# Patient Record
Sex: Female | Born: 1995 | Race: Black or African American | Hispanic: No | Smoking: Former smoker
Health system: Southern US, Community
[De-identification: ages and names within clinical notes are randomized; demographics above are authoritative.]

## PROBLEM LIST (undated history)

## (undated) DIAGNOSIS — F329 Major depressive disorder, single episode, unspecified: Secondary | ICD-10-CM

## (undated) DIAGNOSIS — N760 Acute vaginitis: Secondary | ICD-10-CM

## (undated) DIAGNOSIS — B9689 Other specified bacterial agents as the cause of diseases classified elsewhere: Secondary | ICD-10-CM

## (undated) DIAGNOSIS — F419 Anxiety disorder, unspecified: Secondary | ICD-10-CM

## (undated) DIAGNOSIS — Z8619 Personal history of other infectious and parasitic diseases: Secondary | ICD-10-CM

## (undated) DIAGNOSIS — A6000 Herpesviral infection of urogenital system, unspecified: Secondary | ICD-10-CM

## (undated) HISTORY — DX: Major depressive disorder, single episode, unspecified: F32.9

## (undated) HISTORY — DX: Anxiety disorder, unspecified: F41.9

---
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
## 2015-01-02 HISTORY — PX: WISDOM TOOTH EXTRACTION: SHX21

## 2015-11-13 ENCOUNTER — Ambulatory Visit
Admission: EM | Admit: 2015-11-13 | Discharge: 2015-11-13 | Disposition: A | Discharge status: Home / Self Care | Payer: BLUE CROSS/BLUE SHIELD | Attending: Family Medicine | Admitting: Family Medicine

## 2015-11-13 DIAGNOSIS — B373 Candidiasis of vulva and vagina: Secondary | ICD-10-CM | POA: Diagnosis not present

## 2015-11-13 DIAGNOSIS — N760 Acute vaginitis: Secondary | ICD-10-CM | POA: Diagnosis not present

## 2015-11-13 DIAGNOSIS — B3731 Acute candidiasis of vulva and vagina: Secondary | ICD-10-CM

## 2015-11-13 DIAGNOSIS — N766 Ulceration of vulva: Secondary | ICD-10-CM | POA: Diagnosis not present

## 2015-11-13 LAB — URINALYSIS COMPLETE WITH MICROSCOPIC (ARMC ONLY)
Bilirubin Urine: NEGATIVE
Glucose, UA: NEGATIVE mg/dL
Hgb urine dipstick: NEGATIVE
KETONES UR: NEGATIVE mg/dL
NITRITE: NEGATIVE
PH: 7 (ref 5.0–8.0)
PROTEIN: NEGATIVE mg/dL
SPECIFIC GRAVITY, URINE: 1.025 (ref 1.005–1.030)

## 2015-11-13 LAB — CHLAMYDIA/NGC RT PCR (ARMC ONLY)
Chlamydia Tr: NOT DETECTED
N GONORRHOEAE: NOT DETECTED

## 2015-11-13 LAB — WET PREP, GENITAL
Clue Cells Wet Prep HPF POC: NONE SEEN
Sperm: NONE SEEN
TRICH WET PREP: NONE SEEN

## 2015-11-13 LAB — PREGNANCY, URINE: Preg Test, Ur: NEGATIVE

## 2015-11-13 MED ORDER — METRONIDAZOLE 500 MG PO TABS: 500.0000 mg | ORAL_TABLET | Freq: Two times a day (BID) | ORAL | Status: DC

## 2015-11-13 MED ORDER — FLUCONAZOLE 150 MG PO TABS
150.0000 mg | ORAL_TABLET | Freq: Once | ORAL | Status: DC
Start: 2015-11-13 — End: 2016-04-28

## 2015-11-13 MED ORDER — VALACYCLOVIR HCL 1 G PO TABS: 1000.0000 mg | ORAL_TABLET | Freq: Two times a day (BID) | ORAL | Status: DC

## 2015-11-13 NOTE — ED Provider Notes (Signed)
CSN: [id number]     Arrival date & time 11/13/15  0911 History   First MD Initiated Contact with Patient 11/13/15 [phone]    Nurses notes were reviewed. Chief Complaint  Patient presents with  . Vaginal Pain   Patient is here because of vaginal pain. The back pain started Thursday. She reports the pain over her labia area. She has noticed some lesions over the labia area She's never had anything like this before. She states she has no cyst discharge which she states she's somewhat normal for her. She's never had a pelvic exam she is to have been pregnant before. She reports last time she was sexually active last weekend.  Past history no previous history of operations not any medications no known drug allergies and she's never smoked.. No pertinent family medical past or surgical history for her.  (Consider location/radiation/quality/duration/timing/severity/associated sxs/prior Treatment) Patient is a 20 y.o. female presenting with vaginal discharge. The history is provided by the patient. No language interpreter was used.  Vaginal Discharge Quality:  Mucopurulent Severity:  Mild Onset quality:  Unable to specify Chronicity:  New Context: not after intercourse and not after urination   Relieved by:  Nothing Worsened by:  Nothing tried Associated symptoms: genital lesions, rash and vaginal itching   Risk factors: unprotected sex     History reviewed. No pertinent past medical history. Past Surgical History  Procedure Laterality Date  . Wisdom tooth extraction  01/2015  . Tonsillectomy  05/2014   History reviewed. No pertinent family history. Social History  Substance Use Topics  . Smoking status: Never Smoker   . Smokeless tobacco: None  . Alcohol Use: 0.0 oz/week    0 Standard drinks or equivalent per week     Comment: occasionally   OB History    No data available     Review of Systems  Genitourinary: Positive for vaginal discharge and vaginal pain.  All other systems  reviewed and are negative.   Allergies  Review of patient's allergies indicates no known allergies.  Home Medications   Prior to Admission medications   Medication Sig Start Date End Date Taking? Authorizing Provider  fluconazole (DIFLUCAN) 150 MG tablet Take 1 tablet (150 mg total) by mouth once. 11/13/15   Hassan RowanEugene Wiatt Mahabir, MD  metroNIDAZOLE (FLAGYL) 500 MG tablet Take 1 tablet (500 mg total) by mouth 2 (two) times daily. 11/13/15   Hassan RowanEugene Marlea Gambill, MD  valACYclovir (VALTREX) 1000 MG tablet Take 1 tablet (1,000 mg total) by mouth 2 (two) times daily. 11/13/15   Hassan RowanEugene Ramell Wacha, MD   Meds Ordered and Administered this Visit  Medications - No data to display  BP 113/74 mmHg  Pulse 89  Temp(Src) 98.2 F (36.8 C) (Oral)  Resp 17  Ht 5\' 3"  (1.6 m)  Wt 190 lb (86.183 kg)  BMI 33.67 kg/m2  SpO2 100%  LMP 10/26/2015 No data found.   Physical Exam  Constitutional: She is oriented to person, place, and time. She appears well-developed.  HENT:  Head: Normocephalic and atraumatic.  Eyes: Pupils are equal, round, and reactive to light.  Neck: Normal range of motion.  Abdominal: Soft. Bowel sounds are normal. She exhibits no distension. There is no tenderness. Hernia confirmed negative in the right inguinal area and confirmed negative in the left inguinal area.  Genitourinary: Rectal exam shows no external hemorrhoid, no internal hemorrhoid, no fissure and no tenderness.    There is lesion on the right labia. There is no injury on the right labia.  There is lesion on the left labia. There is no injury on the left labia. Uterus is not deviated, not enlarged, not fixed and not tender. Cervix exhibits no motion tenderness, no discharge and no friability. Right adnexum displays no mass and no tenderness. Left adnexum displays no mass and no tenderness. No tenderness or bleeding in the vagina. No foreign body around the vagina. Vaginal discharge found.  Multiple ulcerations along the labia both the right left  labia. Heavy discharge present. Patient was upset during the whole examination informed us that she never had a pelvic exam before. Bimanual examination was challenging since patient was unable to relax. With the challenges of speculum examination no rectal examination was done and examination was ended since patient was visibly upset.  Musculoskeletal: Normal range of motion.  Lymphadenopathy:       Right: No inguinal adenopathy present.  Neurological: She is alert and oriented to person, place, and time.  Skin: Skin is warm.  Psychiatric: She has a normal mood and affect. Her behavior is normal.  Vitals reviewed.   ED Course  Procedures (including critical care time)  Labs Review Labs Reviewed  WET PREP, GENITAL - Abnormal; Notable for the following:    Yeast Wet Prep HPF POC PRESENT (*)    WBC, Wet Prep HPF POC MANY (*)    All other components within normal limits  URINALYSIS COMPLETEWITH MICROSCOPIC ([facility] ONLY) - Abnormal; Notable for the following:    Leukocytes, UA TRACE (*)    Bacteria, UA FEW (*)    Squamous Epithelial / LPF 0-5 (*)    All other components within normal limits  CHLAMYDIA/NGC RT PCR ([facility] ONLY)  HSV CULTURE AND TYPING  PREGNANCY, URINE  RPR  HIV ANTIBODY (ROUTINE TESTING)    Imaging Review No results found.   Visual Acuity Review  Right Eye Distance:   Left Eye Distance:   Bilateral Distance:    Right Eye Near:   Left Eye Near:    Bilateral Near:     Results for orders placed or performed during the hospital encounter of 11/13/15  Wet prep, genital  Result Value Ref Range   Yeast Wet Prep HPF POC PRESENT (A) NONE SEEN   Trich, Wet Prep NONE SEEN NONE SEEN   Clue Cells Wet Prep HPF POC NONE SEEN NONE SEEN   WBC, Wet Prep HPF POC MANY (A) NONE SEEN   Sperm NONE SEEN   Urinalysis complete, with microscopic  Result Value Ref Range   Color, Urine YELLOW YELLOW   APPearance CLEAR CLEAR   Glucose, UA NEGATIVE NEGATIVE mg/dL    Bilirubin Urine NEGATIVE NEGATIVE   Ketones, ur NEGATIVE NEGATIVE mg/dL   Specific Gravity, Urine 1.025 1.005 - 1.030   Hgb urine dipstick NEGATIVE NEGATIVE   pH 7.0 5.0 - 8.0   Protein, ur NEGATIVE NEGATIVE mg/dL   Nitrite NEGATIVE NEGATIVE   Leukocytes, UA TRACE (A) NEGATIVE   RBC / HPF 0-5 0 - 5 RBC/hpf   WBC, UA 6-30 0 - 5 WBC/hpf   Bacteria, UA FEW (A) NONE SEEN   Squamous Epithelial / LPF 0-5 (A) NONE SEEN   Mucous PRESENT   Pregnancy, urine  Result Value Ref Range   Preg Test, Ur NEGATIVE NEGATIVE     MDM   1. Yeast infection of the vagina   2. Ulcer of genital labia   3. Vaginitis     At this time explain to her she has a yeast vaginitis will treat with  Diflucan, she has a heavy vaginal discharge, no clue cells were seen will treat still with Flagyl since she has had had a discharge lately before the lesions occur, and for the lesions were going to place her on Valtrex 1 g twice a day for week for looks like herpes simplex infection. Staff was instructed to inform patient cannot say this is herpes yet until we have the culture back by herpes culture was obtained. Due to her concern over the discharge and lesions she's agreed to go ahead and have HIV and RPR test done as well andtest are pending. Work note given for today and tomorrow as well. Follow-up PCP of choice.   Note: This dictation was prepared with Dragon dictation along with smaller phrase technology. Any transcriptional errors that result from this process are unintentional.     Hassan Rowan, MD 11/13/15 1149

## 2015-11-13 NOTE — ED Notes (Signed)
Patient complains of vaginal pain. Patient states that she has been having pain since last Thursday or Friday. Patient states that she is also having an abnormal discharge. No known exposure to STD. Patient states that she has pain upon urination with some frequency.

## 2015-11-13 NOTE — Discharge Instructions (Signed)
Vaginitis Vaginitis is an inflammation of the vagina. It can happen when the normal bacteria and yeast in the vagina grow too much. There are different types. Treatment will depend on the type you have. HOME CARE  Take all medicines as told by your doctor.  Keep your vagina area clean and dry. Avoid soap. Rinse the area with water.  Avoid washing and cleaning out the vagina (douching).  Do not use tampons or have sex (intercourse) until your treatment is done.  Wipe from front to back after going to the restroom.  Wear cotton underwear.  Avoid wearing underwear while you sleep until your vaginitis is gone.  Avoid tight pants. Avoid underwear or nylons without a cotton panel.  Take off wet clothing (such as a bathing suit) as soon as you can.  Use mild, unscented products. Avoid fabric softeners and scented:  Feminine sprays.  Laundry detergents.  Tampons.  Soaps or bubble baths.  Practice safe sex and use condoms. GET HELP RIGHT AWAY IF:   You have belly (abdominal) pain.  You have a fever or lasting symptoms for more than 2-3 days.  You have a fever and your symptoms suddenly get worse. MAKE SURE YOU:   Understand these instructions.  Will watch this condition.  Will get help right away if you are not doing well or get worse.   This information is not intended to replace advice given to you by your health care provider. Make sure you discuss any questions you have with your health care provider.   Document Released: 08/16/2008 Document Revised: 02/12/2012 Document Reviewed: 10/31/2011 Elsevier Interactive Patient Education 2016 Elsevier Inc.  Monilial Vaginitis Vaginitis in a soreness, swelling and redness (inflammation) of the vagina and vulva. Monilial vaginitis is not a sexually transmitted infection. CAUSES  Yeast vaginitis is caused by yeast (candida) that is normally found in your vagina. With a yeast infection, the candida has overgrown in number to a  point that upsets the chemical balance. SYMPTOMS   White, thick vaginal discharge.  Swelling, itching, redness and irritation of the vagina and possibly the lips of the vagina (vulva).  Burning or painful urination.  Painful intercourse. DIAGNOSIS  Things that may contribute to monilial vaginitis are:  Postmenopausal and virginal states.  Pregnancy.  Infections.  Being tired, sick or stressed, especially if you had monilial vaginitis in the past.  Diabetes. Good control will help lower the chance.  Birth control pills.  Tight fitting garments.  Using bubble bath, feminine sprays, douches or deodorant tampons.  Taking certain medications that kill germs (antibiotics).  Sporadic recurrence can occur if you become ill. TREATMENT  Your caregiver will give you medication.  There are several kinds of anti monilial vaginal creams and suppositories specific for monilial vaginitis. For recurrent yeast infections, use a suppository or cream in the vagina 2 times a week, or as directed.  Anti-monilial or steroid cream for the itching or irritation of the vulva may also be used. Get your caregiver's permission.  Painting the vagina with methylene blue solution may help if the monilial cream does not work.  Eating yogurt may help prevent monilial vaginitis. HOME CARE INSTRUCTIONS   Finish all medication as prescribed.  Do not have sex until treatment is completed or after your caregiver tells you it is okay.  Take warm sitz baths.  Do not douche.  Do not use tampons, especially scented ones.  Wear cotton underwear.  Avoid tight pants and panty hose.  Tell your sexual partner  that you have a yeast infection. They should go to their caregiver if they have symptoms such as mild rash or itching.  Your sexual partner should be treated as well if your infection is difficult to eliminate.  Practice safer sex. Use condoms.  Some vaginal medications cause latex condoms to  fail. Vaginal medications that harm condoms are:  Cleocin cream.  Butoconazole (Femstat).  Terconazole (Terazol) vaginal suppository.  Miconazole (Monistat) (may be purchased over the counter). SEEK MEDICAL CARE IF:   You have a temperature by mouth above 102 F (38.9 C).  The infection is getting worse after 2 days of treatment.  The infection is not getting better after 3 days of treatment.  You develop blisters in or around your vagina.  You develop vaginal bleeding, and it is not your menstrual period.  You have pain when you urinate.  You develop intestinal problems.  You have pain with sexual intercourse.   This information is not intended to replace advice given to you by your health care provider. Make sure you discuss any questions you have with your health care provider.   Document Released: 02/27/2005 Document Revised: 08/12/2011 Document Reviewed: 11/21/2014 Elsevier Interactive Patient Education 2016 Elsevier Inc. Genital Herpes Genital herpes is a common sexually transmitted infection (STI) that is caused by a virus. The virus is spread from person to person through sexual contact. Infection can cause itching, blisters, and sores in the genital area or rectal area. This is called an outbreak. It affects both men and women. Genital herpes is particularly concerning for pregnant women because the virus can be passed to the baby during delivery and cause serious problems. Genital herpes is also a concern for people with a weakened defense (immune) system. Symptoms of genital herpes may last several days and then go away. However, the virus remains in your body, so you may have more outbreaks of symptoms in the future. The time between outbreaks varies and can be months or years. CAUSES Genital herpes is caused by a virus called herpes simplex virus (HSV) type 2 or HSV type 1. These viruses are contagious and are most often spread through sexual contact with an  infected person. Sexual contact includes vaginal, anal, and oral sex. RISK FACTORS Risk factors for genital herpes include: Being sexually active with multiple partners. Having unprotected sex. SIGNS AND SYMPTOMS Symptoms may include: Pain and itching in the genital area or rectal area. Small red bumps that turn into blisters and then turn into sores. Flu-like symptoms, including: Fever. Body aches. Painful urination. Vaginal discharge. DIAGNOSIS Genital herpes may be diagnosed by: Physical exam. Blood test. Fluid culture test from an open sore. TREATMENT There is no cure for genital herpes. Oral antiviral medicines may be used to speed up healing and to help prevent the return of symptoms. These medicines can also help to reduce the spread of the virus to sexual partners. HOME CARE INSTRUCTIONS Keep the affected areas dry and clean. Take medicines only as directed by your health care provider. Do not have sexual contact during active infections. Genital herpes is contagious. Practice safe sex. Latex condoms and female condoms may help to prevent the spread of the herpes virus. Avoid rubbing or touching the blisters and sores. If you do touch the blister or sores: Wash your hands thoroughly. Do not touch your eyes afterward. If you become pregnant, tell your health care provider if you have had genital herpes. Keep all follow-up visits as directed by your health care provider.  This is important. PREVENTION Use condoms. Although anyone can contract genital herpes during sexual contact even with the use of a condom, a condom can provide some protection. Avoid having multiple sexual partners. Talk to your sexual partner about any symptoms and past history that either of you may have. Get tested before you have sex. Ask your partner to do the same. Recognize the symptoms of genital herpes. Do not have sexual contact if you notice these symptoms. SEEK MEDICAL CARE IF: Your symptoms  are not improving with medicine. Your symptoms return. You have new symptoms. You have a fever. You have abdominal pain. You have redness, swelling, or pain in your eye. MAKE SURE YOU: Understand these instructions. Will watch your condition. Will get help right away if you are not doing well or get worse.   This information is not intended to replace advice given to you by your health care provider. Make sure you discuss any questions you have with your health care provider.   Document Released: 05/17/2000 Document Revised: 06/10/2014 Document Reviewed: 10/05/2013 Elsevier Interactive Patient Education [employer]2016 Elsevier Inc.

## 2015-11-14 LAB — RPR: RPR Ser Ql: NONREACTIVE

## 2015-11-14 LAB — HIV ANTIBODY (ROUTINE TESTING W REFLEX): HIV Screen 4th Generation wRfx: NONREACTIVE

## 2015-11-15 LAB — HSV CULTURE AND TYPING

## 2015-11-16 ENCOUNTER — Telehealth: Payer: Self-pay | Admitting: Emergency Medicine

## 2015-11-16 NOTE — ED Notes (Signed)
Patient notified that her HSV culture came back positive.  Patient states that she is taking her Valtrex.  Patient was instructed to follow-up with her PCP if her symptoms do not improve and for future outbreaks.  Patient verbalized understanding.

## 2016-04-28 ENCOUNTER — Encounter: Payer: Self-pay | Admitting: Gynecology

## 2016-04-28 ENCOUNTER — Ambulatory Visit
Admission: EM | Admit: 2016-04-28 | Discharge: 2016-04-28 | Disposition: A | Discharge status: Home / Self Care | Payer: BLUE CROSS/BLUE SHIELD | Attending: Emergency Medicine | Admitting: Emergency Medicine

## 2016-04-28 DIAGNOSIS — A6000 Herpesviral infection of urogenital system, unspecified: Secondary | ICD-10-CM

## 2016-04-28 DIAGNOSIS — Z760 Encounter for issue of repeat prescription: Secondary | ICD-10-CM

## 2016-04-28 HISTORY — DX: Herpesviral infection of urogenital system, unspecified: A60.00

## 2016-04-28 MED ORDER — VALACYCLOVIR HCL 1 G PO TABS: 1000.0000 mg | ORAL_TABLET | Freq: Two times a day (BID) | ORAL | 1 refills | Status: DC

## 2016-04-28 NOTE — ED Triage Notes (Signed)
Patient c/o refill on her medication RX. Valacyclovir.

## 2016-04-28 NOTE — ED Provider Notes (Signed)
HPI  SUBJECTIVE:  Nancy Mcfarland is a 20 y.o. female who presents for refill of her Valtrex. She states that she takes 1000 mg twice a day for 3 days. She reports having a genital herpes outbreak starting 3 days ago and states that she did not have her medicine for this. She states that the Valtrex normally works well for her. She has no other complaints. FAO:ZHYQPMD:[facility]   Past Medical History:  Diagnosis Date  . Herpes genitalia     Past Surgical History:  Procedure Laterality Date  . TONSILLECTOMY  05/2014  . WISDOM TOOTH EXTRACTION  01/2015    No family history on file.  Social History  Substance Use Topics  . Smoking status: Never Smoker  . Smokeless tobacco: Never Used  . Alcohol use 0.0 oz/week     Comment: occasionally    No current facility-administered medications for this encounter.   Current Outpatient Prescriptions:  .  valACYclovir (VALTREX) 1000 MG tablet, Take 1 tablet (1,000 mg total) by mouth 2 (two) times daily., Disp: 12 tablet, Rfl: 1  No Known Allergies   ROS  As noted in HPI.   Physical Exam  BP 134/89 (BP Location: Left Arm)   Pulse 79   Temp 98.8 F (37.1 C) (Oral)   Resp 16   Ht 5\' 3"  (1.6 m)   Wt 180 lb (81.6 kg)   LMP 03/31/2016   SpO2 100%   BMI 31.89 kg/m   Constitutional: Well developed, well nourished, no acute distress Eyes:  EOMI, conjunctiva normal bilaterally HENT: Normocephalic, atraumatic,mucus membranes moist Respiratory: Normal inspiratory effort Cardiovascular: Normal rate GI: nondistended skin: No rash, skin intact Musculoskeletal: no deformities Neurologic: Alert & oriented x 3, no focal neuro deficits Psychiatric: Speech and behavior appropriate   ED Course   Medications - No data to display  No orders of the defined types were placed in this encounter.   No results found for this or any previous visit (from the past 24 hour(s)). No results found.  ED Clinical Impression  Medication  refill  ED Assessment/Plan  E rx;d Valtrex, giving patient 2 courses with one refill. She'll follow-up with her primary care physician as needed.  Meds ordered this encounter  Medications  . valACYclovir (VALTREX) 1000 MG tablet    Sig: Take 1 tablet (1,000 mg total) by mouth 2 (two) times daily.    Dispense:  12 tablet    Refill:  1    *This clinic note was created using Scientist, clinical (histocompatibility and immunogenetics)Dragon dictation software. Therefore, there may be occasional mistakes despite careful proofreading.  ?   Domenick GongAshley Mikeria Valin, MD 04/28/16 [phone]

## 2017-11-03 ENCOUNTER — Ambulatory Visit
Admission: EM | Admit: 2017-11-03 | Discharge: 2017-11-03 | Disposition: A | Discharge status: Home / Self Care | Payer: BLUE CROSS/BLUE SHIELD | Attending: Family Medicine | Admitting: Family Medicine

## 2017-11-03 ENCOUNTER — Other Ambulatory Visit: Payer: Self-pay

## 2017-11-03 DIAGNOSIS — L03312 Cellulitis of back [any part except buttock]: Secondary | ICD-10-CM | POA: Diagnosis not present

## 2017-11-03 DIAGNOSIS — M5489 Other dorsalgia: Secondary | ICD-10-CM

## 2017-11-03 MED ORDER — CEFAZOLIN SODIUM 1 G IJ SOLR
1.0000 g | Freq: Once | INTRAMUSCULAR | Status: AC
  Administered 2017-11-03: 1 g via INTRAMUSCULAR

## 2017-11-03 MED ORDER — SULFAMETHOXAZOLE-TRIMETHOPRIM 800-160 MG PO TABS: 1.0000 | ORAL_TABLET | Freq: Two times a day (BID) | ORAL | 0 refills | Status: DC

## 2017-11-03 MED ORDER — HYDROCODONE-ACETAMINOPHEN 5-325 MG PO TABS: ORAL_TABLET | ORAL | 0 refills | Status: DC

## 2017-11-03 NOTE — ED Provider Notes (Signed)
[facility]    CSN: [id number] Arrival date & time: 11/03/17  0803     History   Chief Complaint Chief Complaint  Patient presents with  . Back Pain    HPI Nancy Mcfarland is a 22 y.o. female.   22 yo female with a c/o pain to the skin on the lower back for the past 2 days. States she thinks that she may have had some bug bites. Denies any fevers, chills or drainage.   The history is provided by the patient.  Back Pain    Past Medical History:  Diagnosis Date  . Herpes genitalia     There are no active problems to display for this patient.   Past Surgical History:  Procedure Laterality Date  . TONSILLECTOMY  05/2014  . WISDOM TOOTH EXTRACTION  01/2015    OB History   None      Home Medications    Prior to Admission medications   Medication Sig Start Date End Date Taking? Authorizing Provider  HYDROcodone-acetaminophen (NORCO/VICODIN) 5-325 MG tablet 1-2 tabs po qd prn 11/03/17   Payton Mccallumonty, Samary Shatz, MD  sulfamethoxazole-trimethoprim (BACTRIM DS,SEPTRA DS) 800-160 MG tablet Take 1 tablet by mouth 2 (two) times daily. 11/03/17   Payton Mccallumonty, Tameisha Covell, MD  valACYclovir (VALTREX) 1000 MG tablet Take 1 tablet (1,000 mg total) by mouth 2 (two) times daily. 04/28/16   Domenick GongMortenson, Ashley, MD    Family History History reviewed. No pertinent family history.  Social History Social History   Tobacco Use  . Smoking status: Never Smoker  . Smokeless tobacco: Never Used  Substance Use Topics  . Alcohol use: Yes    Alcohol/week: 0.0 oz    Comment: occasionally  . Drug use: No     Allergies   Patient has no known allergies.   Review of Systems Review of Systems  Musculoskeletal: Positive for back pain.     Physical Exam Triage Vital Signs ED Triage Vitals  Enc Vitals Group     BP 11/03/17 0816 124/79     Pulse Rate 11/03/17 0816 91     Resp 11/03/17 0816 18     Temp 11/03/17 0816 98.6 F (37 C)     Temp Source 11/03/17 0816 Oral     SpO2 11/03/17  0816 99 %     Weight 11/03/17 0813 203 lb (92.1 kg)     Height 11/03/17 0813 5\' 3"  (1.6 m)     Head Circumference --      Peak Flow --      Pain Score 11/03/17 0813 8     Pain Loc --      Pain Edu? --      Excl. in GC? --    No data found.  Updated Vital Signs BP 124/79 (BP Location: Left Arm)   Pulse 91   Temp 98.6 F (37 C) (Oral)   Resp 18   Ht 5\' 3"  (1.6 m)   Wt 203 lb (92.1 kg)   LMP 10/25/2017   SpO2 99%   BMI 35.96 kg/m   Visual Acuity Right Eye Distance:   Left Eye Distance:   Bilateral Distance:    Right Eye Near:   Left Eye Near:    Bilateral Near:     Physical Exam  Constitutional: She appears well-developed and well-nourished. No distress.  Skin: She is not diaphoretic.  6x3 cm skin area on lower back with blanchable erythema, tenderness to palpation and warmth; also noted 1cm erythematous tender skin  nodule; no drainage; no fluctuance  Nursing note and vitals reviewed.    UC Treatments / Results  Labs (all labs ordered are listed, but only abnormal results are displayed) Labs Reviewed - No data to display  EKG None  Radiology No results found.  Procedures Procedures (including critical care time)  Medications Ordered in UC Medications  ceFAZolin (ANCEF) injection 1 g (1 g Intramuscular Given 11/03/17 0854)    Initial Impression / Assessment and Plan / UC Course  I have reviewed the triage vital signs and the nursing notes.  Pertinent labs & imaging results that were available during my care of the patient were reviewed by me and considered in my medical decision making (see chart for details).     Final Clinical Impressions(s) / UC Diagnoses   Final diagnoses:  Cellulitis of back except buttock   Discharge Instructions   None    ED Prescriptions    Medication Sig Dispense Auth. Provider   sulfamethoxazole-trimethoprim (BACTRIM DS,SEPTRA DS) 800-160 MG tablet Take 1 tablet by mouth 2 (two) times daily. 20 tablet Payton Mccallum,  MD   HYDROcodone-acetaminophen (NORCO/VICODIN) 5-325 MG tablet 1-2 tabs po qd prn 4 tablet Payton Mccallum, MD     1. diagnosis reviewed with patient; Ancef 1gm IM given 2. rx as per orders above; reviewed possible side effects, interactions, risks and benefits  3. Recommend supportive treatment with warm compresses to area, otc analgesics 4. Follow-up prn if symptoms worsen or don't improve   Controlled Substance Prescriptions [place] Controlled Substance Registry consulted? Not Applicable   Payton Mccallum, MD 11/03/17 [phone]

## 2017-11-03 NOTE — ED Triage Notes (Signed)
Patient complains of back pain that she states that she has a noticed a "lump" under her skin near her spine that is causing pain. Patient states that notices the pain constantly and noticed 2 days ago.

## 2018-07-31 ENCOUNTER — Ambulatory Visit
Admission: EM | Admit: 2018-07-31 | Discharge: 2018-07-31 | Disposition: A | Discharge status: Home / Self Care | Payer: BLUE CROSS/BLUE SHIELD | Attending: Family Medicine | Admitting: Family Medicine

## 2018-07-31 ENCOUNTER — Other Ambulatory Visit: Payer: Self-pay

## 2018-07-31 ENCOUNTER — Encounter: Payer: Self-pay | Admitting: Emergency Medicine

## 2018-07-31 DIAGNOSIS — B9689 Other specified bacterial agents as the cause of diseases classified elsewhere: Secondary | ICD-10-CM

## 2018-07-31 DIAGNOSIS — N760 Acute vaginitis: Secondary | ICD-10-CM

## 2018-07-31 LAB — WET PREP, GENITAL
Clue Cells Wet Prep HPF POC: NONE SEEN
SPERM: NONE SEEN
Trich, Wet Prep: NONE SEEN
Yeast Wet Prep HPF POC: NONE SEEN

## 2018-07-31 MED ORDER — METRONIDAZOLE 500 MG PO TABS: 500.0000 mg | ORAL_TABLET | Freq: Two times a day (BID) | ORAL | 0 refills | Status: DC

## 2018-07-31 NOTE — ED Triage Notes (Signed)
Patient in today c/o vaginal discharge and itching x 1 day. Patient states she is not concerned with STDs. She is not sexually active at this time.

## 2018-07-31 NOTE — ED Provider Notes (Signed)
[facility]    CSN: [id number] Arrival date & time: 07/31/18  1136     History   Chief Complaint Chief Complaint  Patient presents with  . Vaginal Discharge  . Vaginal Itching    HPI Nancy Mcfarland is a 23 y.o. female.   The history is provided by the patient.  Vaginal Discharge  Quality:  White Severity:  Mild Onset quality:  Sudden Duration:  1 day Timing:  Constant Progression:  Unchanged Chronicity:  New Context: spontaneously   Context: not after intercourse, not after urination, not at rest, not during bowel movement, not during intercourse, not during pregnancy, not during urination, not genital trauma and not recent antibiotic use   Relieved by:  None tried Ineffective treatments:  None tried Associated symptoms: vaginal itching   Risk factors: no STI exposure and no unprotected sex   Vaginal Itching     Past Medical History:  Diagnosis Date  . Herpes genitalia     There are no active problems to display for this patient.   Past Surgical History:  Procedure Laterality Date  . TONSILLECTOMY  05/2014  . WISDOM TOOTH EXTRACTION  01/2015    OB History   No obstetric history on file.      Home Medications    Prior to Admission medications   Medication Sig Start Date End Date Taking? Authorizing Provider  HYDROcodone-acetaminophen (NORCO/VICODIN) 5-325 MG tablet 1-2 tabs po qd prn 11/03/17   Payton Mccallum, MD  metroNIDAZOLE (FLAGYL) 500 MG tablet Take 1 tablet (500 mg total) by mouth 2 (two) times daily. 07/31/18   Payton Mccallum, MD  sulfamethoxazole-trimethoprim (BACTRIM DS,SEPTRA DS) 800-160 MG tablet Take 1 tablet by mouth 2 (two) times daily. 11/03/17   Payton Mccallum, MD  valACYclovir (VALTREX) 1000 MG tablet Take 1 tablet (1,000 mg total) by mouth 2 (two) times daily. 04/28/16   Domenick Gong, MD    Family History Family History  Problem Relation Age of Onset  . Other Mother        unknown medical history  . Healthy  Father     Social History Social History   Tobacco Use  . Smoking status: Never Smoker  . Smokeless tobacco: Never Used  Substance Use Topics  . Alcohol use: Yes    Alcohol/week: 0.0 standard drinks    Comment: rarely  . Drug use: No     Allergies   Patient has no known allergies.   Review of Systems Review of Systems  Genitourinary: Positive for vaginal discharge.     Physical Exam Triage Vital Signs ED Triage Vitals [07/31/18 1145]  Enc Vitals Group     BP 126/85     Pulse Rate 88     Resp 16     Temp 98.1 F (36.7 C)     Temp Source Oral     SpO2 99 %     Weight 215 lb (97.5 kg)     Height 5\' 3"  (1.6 m)     Head Circumference      Peak Flow      Pain Score 0     Pain Loc      Pain Edu?      Excl. in GC?    No data found.  Updated Vital Signs BP 126/85 (BP Location: Left Arm)   Pulse 88   Temp 98.1 F (36.7 C) (Oral)   Resp 16   Ht 5\' 3"  (1.6 m)   Wt 97.5 kg  LMP 07/17/2018 (Exact Date)   SpO2 99%   BMI 38.09 kg/m   Visual Acuity Right Eye Distance:   Left Eye Distance:   Bilateral Distance:    Right Eye Near:   Left Eye Near:    Bilateral Near:     Physical Exam Vitals signs and nursing note reviewed.  Constitutional:      General: She is not in acute distress.    Appearance: She is not toxic-appearing or diaphoretic.  Neurological:     Mental Status: She is alert.      UC Treatments / Results  Labs (all labs ordered are listed, but only abnormal results are displayed) Labs Reviewed  WET PREP, GENITAL - Abnormal; Notable for the following components:      Result Value   WBC, Wet Prep HPF POC MODERATE (*)    All other components within normal limits    EKG None  Radiology No results found.  Procedures Procedures (including critical care time)  Medications Ordered in UC Medications - No data to display  Initial Impression / Assessment and Plan / UC Course  I have reviewed the triage vital signs and the nursing  notes.  Pertinent labs & imaging results that were available during my care of the patient were reviewed by me and considered in my medical decision making (see chart for details).      Final Clinical Impressions(s) / UC Diagnoses   Final diagnoses:  Bacterial vaginosis    ED Prescriptions    Medication Sig Dispense Auth. Provider   metroNIDAZOLE (FLAGYL) 500 MG tablet Take 1 tablet (500 mg total) by mouth 2 (two) times daily. 14 tablet Crysta Gulick, Pamala Hurry, MD     1. diagnosis reviewed with patient 2. rx as per orders above; reviewed possible side effects, interactions, risks and benefits  3. Follow-up prn if symptoms worsen or don't improve   Controlled Substance Prescriptions [place] Controlled Substance Registry consulted? Not Applicable   Payton Mccallum, MD 07/31/18 1256

## 2019-08-20 ENCOUNTER — Encounter: Payer: Self-pay | Admitting: Emergency Medicine

## 2019-08-20 ENCOUNTER — Other Ambulatory Visit: Payer: Self-pay

## 2019-08-20 ENCOUNTER — Ambulatory Visit
Admission: EM | Admit: 2019-08-20 | Discharge: 2019-08-20 | Disposition: A | Discharge status: Home / Self Care | Payer: BC Managed Care – PPO | Attending: Family Medicine | Admitting: Family Medicine

## 2019-08-20 DIAGNOSIS — N760 Acute vaginitis: Secondary | ICD-10-CM

## 2019-08-20 DIAGNOSIS — B9689 Other specified bacterial agents as the cause of diseases classified elsewhere: Secondary | ICD-10-CM | POA: Diagnosis not present

## 2019-08-20 HISTORY — DX: Acute vaginitis: N76.0

## 2019-08-20 HISTORY — DX: Personal history of other infectious and parasitic diseases: Z86.19

## 2019-08-20 HISTORY — DX: Other specified bacterial agents as the cause of diseases classified elsewhere: B96.89

## 2019-08-20 LAB — WET PREP, GENITAL
Sperm: NONE SEEN
Trich, Wet Prep: NONE SEEN
Yeast Wet Prep HPF POC: NONE SEEN

## 2019-08-20 MED ORDER — METRONIDAZOLE 500 MG PO TABS: 500.0000 mg | ORAL_TABLET | Freq: Two times a day (BID) | ORAL | 0 refills | Status: DC

## 2019-08-20 NOTE — ED Triage Notes (Addendum)
Patient in today c/o slight vaginal discharge x 2 days. Patient is concerned about an infection. She states she and her boyfriend had anal sex and then had vaginal intercourse. Patient denies any concern for STDs.

## 2019-08-20 NOTE — Discharge Instructions (Addendum)
It was very nice seeing you today in clinic. Thank you for entrusting me with your care.   Wet prep swab did not reveal any candida or trichomonas, however the sample was (+) for clue cells, which is consistent with bacterial vaginosis (BV) infection.   Will treat with a 7 day course of oral metronidazole.   Complete the entire course of antibiotics even if you feel better.   Avoid all ETOH while taking this medication in order to prevent a disulfiram like reaction that will result in significant nausea and vomiting.   Increase fluid intake as much as possible. Discussed that water is always best to flush the urinary tract and prevent development of a urinary tract infection while on treatment for the BV.  Make arrangements to follow up with your regular doctor in 1 week for re-evaluation if not improving. If your symptoms/condition worsens, please seek follow up care either here or in the ER. Please remember, our [facility] providers are "right here with you" when you need [place].   Again, it was my pleasure to take care of you today. Thank you for choosing our clinic. I hope that you start to feel better quickly.   Quentin Mulling, MSN, APRN, FNP-C, CEN Advanced Practice Provider [facility] [place] [facility]

## 2019-08-21 ENCOUNTER — Encounter: Payer: Self-pay | Admitting: Urgent Care
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
## 2020-05-07 ENCOUNTER — Ambulatory Visit
Admission: EM | Admit: 2020-05-07 | Discharge: 2020-05-07 | Disposition: A | Discharge status: Home / Self Care | Payer: BC Managed Care – PPO | Attending: Emergency Medicine | Admitting: Emergency Medicine

## 2020-05-07 ENCOUNTER — Other Ambulatory Visit: Payer: Self-pay

## 2020-05-07 DIAGNOSIS — N898 Other specified noninflammatory disorders of vagina: Secondary | ICD-10-CM | POA: Diagnosis not present

## 2020-05-07 MED ORDER — METRONIDAZOLE 500 MG PO TABS: 500.0000 mg | ORAL_TABLET | Freq: Two times a day (BID) | ORAL | 0 refills | Status: DC

## 2020-05-07 MED ORDER — FLUCONAZOLE 200 MG PO TABS: 200.0000 mg | ORAL_TABLET | Freq: Every day | ORAL | 0 refills | Status: AC

## 2020-05-07 NOTE — ED Provider Notes (Signed)
[facility]    CSN: [id number] Arrival date & time: 05/07/20  7253      History   Chief Complaint Chief Complaint  Patient presents with  . Vaginal Itching    HPI Nancy Mcfarland is a 24 y.o. female.   HPI   24 year old female here for evaluation of vaginal itching.  Patient reports that she started 3 days ago and she was having a white discharge at the time with a slightly fishy odor.  Patient does have a history of BV.  Patient denies fever, pain with urination or increased urinary frequency, back pain, abdominal pain, nausea, vomiting, diarrhea.  Patient is currently on her menses.  Past Medical History:  Diagnosis Date  . BV (bacterial vaginosis)   . Herpes genitalia   . History of chlamydia     There are no problems to display for this patient.   Past Surgical History:  Procedure Laterality Date  . TONSILLECTOMY  05/2014  . WISDOM TOOTH EXTRACTION  01/2015    OB History   No obstetric history on file.      Home Medications    Prior to Admission medications   Medication Sig Start Date End Date Taking? Authorizing Provider  fluconazole (DIFLUCAN) 200 MG tablet Take 1 tablet (200 mg total) by mouth daily for 7 days. 05/07/20 05/14/20  Becky Augusta, NP  metroNIDAZOLE (FLAGYL) 500 MG tablet Take 1 tablet (500 mg total) by mouth 2 (two) times daily. 05/07/20   Becky Augusta, NP    Family History Family History  Problem Relation Age of Onset  . Other Mother        unknown medical history  . Healthy Father     Social History Social History   Tobacco Use  . Smoking status: Never Smoker  . Smokeless tobacco: Never Used  Vaping Use  . Vaping Use: Never used  Substance Use Topics  . Alcohol use: Yes    Alcohol/week: 0.0 standard drinks    Comment: rarely  . Drug use: No     Allergies   Patient has no known allergies.   Review of Systems Review of Systems  Constitutional: Negative for activity change, appetite change and fever.   Gastrointestinal: Negative for abdominal pain, nausea and vomiting.  Genitourinary: Positive for vaginal bleeding and vaginal discharge. Negative for dysuria, frequency, pelvic pain, urgency and vaginal pain.  Musculoskeletal: Negative for back pain.  Skin: Negative for rash.  Hematological: Negative.   Psychiatric/Behavioral: Negative.      Physical Exam Triage Vital Signs ED Triage Vitals  Enc Vitals Group     BP 05/07/20 1023 (!) 123/94     Pulse Rate 05/07/20 1023 82     Resp 05/07/20 1023 18     Temp 05/07/20 1023 98.3 F (36.8 C)     Temp Source 05/07/20 1023 Oral     SpO2 05/07/20 1023 100 %     Weight 05/07/20 1019 220 lb (99.8 kg)     Height 05/07/20 1019 5\' 3"  (1.6 m)     Head Circumference --      Peak Flow --      Pain Score 05/07/20 1019 0     Pain Loc --      Pain Edu? --      Excl. in GC? --    No data found.  Updated Vital Signs BP (!) 123/94 (BP Location: Right Arm)   Pulse 82   Temp 98.3 F (36.8 C) (Oral)  Resp 18   Ht 5\' 3"  (1.6 m)   Wt 220 lb (99.8 kg)   LMP 05/06/2020   SpO2 100%   BMI 38.97 kg/m   Visual Acuity Right Eye Distance:   Left Eye Distance:   Bilateral Distance:    Right Eye Near:   Left Eye Near:    Bilateral Near:     Physical Exam Vitals and nursing note reviewed.  Constitutional:      General: She is not in acute distress.    Appearance: Normal appearance. She is obese. She is not toxic-appearing.  HENT:     Head: Normocephalic and atraumatic.  Eyes:     General: No scleral icterus.    Extraocular Movements: Extraocular movements intact.     Conjunctiva/sclera: Conjunctivae normal.     Pupils: Pupils are equal, round, and reactive to light.  Cardiovascular:     Rate and Rhythm: Normal rate and regular rhythm.     Pulses: Normal pulses.     Heart sounds: Normal heart sounds. No murmur heard.  No gallop.   Pulmonary:     Effort: Pulmonary effort is normal.     Breath sounds: Normal breath sounds. No  wheezing, rhonchi or rales.  Abdominal:     Tenderness: There is no right CVA tenderness or left CVA tenderness.  Musculoskeletal:        General: No swelling or tenderness. Normal range of motion.  Skin:    General: Skin is warm and dry.     Capillary Refill: Capillary refill takes less than 2 seconds.     Findings: No erythema or rash.  Neurological:     General: No focal deficit present.     Mental Status: She is alert and oriented to person, place, and time.  Psychiatric:        Mood and Affect: Mood normal.        Behavior: Behavior normal.        Thought Content: Thought content normal.        Judgment: Judgment normal.      UC Treatments / Results  Labs (all labs ordered are listed, but only abnormal results are displayed) Labs Reviewed  WET PREP, GENITAL    EKG   Radiology No results found.  Procedures Procedures (including critical care time)  Medications Ordered in UC Medications - No data to display  Initial Impression / Assessment and Plan / UC Course  I have reviewed the triage vital signs and the nursing notes.  Pertinent labs & imaging results that were available during my care of the patient were reviewed by me and considered in my medical decision making (see chart for details).   Patient is here for evaluation of vaginal itching with a white discharge that has been going on for the past 3 days.  Patient states that has an odor and when asked if it was fishy smelling she said that the closest that she can describe it.  Patient does have a history of BV.  Attempted to collect a wet prep but patient is on her menses and lab was only able to see red blood cells.  Will treat patient for BV and yeast with Flagyl twice daily x7 days and Diflucan.   Final Clinical Impressions(s) / UC Diagnoses   Final diagnoses:  Vaginal itching  Vaginal discharge     Discharge Instructions     Take the Flagyl twice daily for 7 days.  Take 1 Diflucan tablet now  and repeat  in 1 week if your symptoms continue.  If your symptoms persist follow-up with your GYN.    ED Prescriptions    Medication Sig Dispense Auth. Provider   metroNIDAZOLE (FLAGYL) 500 MG tablet Take 1 tablet (500 mg total) by mouth 2 (two) times daily. 14 tablet Becky Augusta, NP   fluconazole (DIFLUCAN) 200 MG tablet Take 1 tablet (200 mg total) by mouth daily for 7 days. 7 tablet Becky Augusta, NP     PDMP not reviewed this encounter.   Becky Augusta, NP 05/07/20 1109

## 2020-05-07 NOTE — Discharge Instructions (Addendum)
Take the Flagyl twice daily for 7 days.  Take 1 Diflucan tablet now and repeat in 1 week if your symptoms continue.  If your symptoms persist follow-up with your GYN.

## 2020-05-07 NOTE — ED Triage Notes (Signed)
Patient states that she has been having vaginal itching x Thursday. States that she is concerned for BV or yeast. States that she is not concerned for STDs and is currently on her cycle.

## 2020-12-27 ENCOUNTER — Other Ambulatory Visit: Payer: Self-pay

## 2020-12-27 ENCOUNTER — Ambulatory Visit
Admission: EM | Admit: 2020-12-27 | Discharge: 2020-12-27 | Disposition: A | Discharge status: Home / Self Care | Payer: BC Managed Care – PPO | Attending: Family Medicine | Admitting: Family Medicine

## 2020-12-27 ENCOUNTER — Other Ambulatory Visit (HOSPITAL_COMMUNITY)
Admission: RE | Admit: 2020-12-27 | Discharge: 2020-12-27 | Disposition: A | Discharge status: Home / Self Care | Payer: BC Managed Care – PPO | Source: Ambulatory Visit | Attending: Family Medicine | Admitting: Family Medicine

## 2020-12-27 DIAGNOSIS — N898 Other specified noninflammatory disorders of vagina: Secondary | ICD-10-CM | POA: Insufficient documentation

## 2020-12-27 MED ORDER — FLUCONAZOLE 150 MG PO TABS: 150.0000 mg | ORAL_TABLET | Freq: Once | ORAL | 0 refills | Status: AC

## 2020-12-27 NOTE — ED Provider Notes (Signed)
[facility]    CSN: [id number] Arrival date & time: 12/27/20  1639      History   Chief Complaint Chief Complaint  Patient presents with  . Vaginal Itching    HPI 25 year old female presents with vaginal discharge.  2-day history of white vaginal discharge and associated itching.  Denies concerns for STD.  No urinary symptoms.  No medications or interventions tried.  No relieving factors.  No other complaints.  Past Medical History:  Diagnosis Date  . BV (bacterial vaginosis)   . Herpes genitalia   . History of chlamydia    Past Surgical History:  Procedure Laterality Date  . TONSILLECTOMY  05/2014  . WISDOM TOOTH EXTRACTION  01/2015   OB History   No obstetric history on file.    Home Medications    Prior to Admission medications   Medication Sig Start Date End Date Taking? Authorizing Provider  fluconazole (DIFLUCAN) 150 MG tablet Take 1 tablet (150 mg total) by mouth once for 1 dose. Repeat dose in 72 hours. 12/27/20 12/27/20 Yes Tommie Sams, DO  valACYclovir (VALTREX) 500 MG tablet Take by mouth. 12/20/20  Yes [provider]    Family History Family History  Problem Relation Age of Onset  . Other Mother        unknown medical history  . Healthy Father     Social History Social History   Tobacco Use  . Smoking status: Never  . Smokeless tobacco: Never  Vaping Use  . Vaping Use: Never used  Substance Use Topics  . Alcohol use: Yes    Alcohol/week: 0.0 standard drinks    Comment: rarely  . Drug use: No     Allergies   Patient has no known allergies.   Review of Systems Review of Systems  Constitutional: Negative.   Genitourinary:  Positive for vaginal discharge.    Physical Exam Triage Vital Signs ED Triage Vitals  Enc Vitals Group     BP 12/27/20 1712 125/75     Pulse Rate 12/27/20 1712 85     Resp 12/27/20 1712 18     Temp 12/27/20 1712 98.4 F (36.9 C)     Temp Source 12/27/20 1712 Oral     SpO2 12/27/20  1712 100 %     Weight 12/27/20 1710 220 lb (99.8 kg)     Height 12/27/20 1710 5\' 3"  (1.6 m)     Head Circumference --      Peak Flow --      Pain Score 12/27/20 1741 0     Pain Loc --      Pain Edu? --      Excl. in GC? --    No data found.  Updated Vital Signs BP 125/75 (BP Location: Left Arm)   Pulse 85   Temp 98.4 F (36.9 C) (Oral)   Resp 18   Ht 5\' 3"  (1.6 m)   Wt 99.8 kg   LMP 12/07/2020   SpO2 100%   BMI 38.97 kg/m   Visual Acuity Right Eye Distance:   Left Eye Distance:   Bilateral Distance:    Right Eye Near:   Left Eye Near:    Bilateral Near:     Physical Exam Vitals and nursing note reviewed.  Constitutional:      General: She is not in acute distress.    Appearance: Normal appearance. She is obese. She is not ill-appearing.  HENT:     Head: Normocephalic and atraumatic.  Cardiovascular:     Rate and Rhythm: Normal rate and regular rhythm.     Heart sounds: No murmur heard. Pulmonary:     Effort: Pulmonary effort is normal.     Breath sounds: Normal breath sounds. No wheezing, rhonchi or rales.  Abdominal:     General: There is no distension.     Palpations: Abdomen is soft.     Tenderness: There is no abdominal tenderness.  Neurological:     Mental Status: She is alert.  Psychiatric:        Mood and Affect: Mood normal.        Behavior: Behavior normal.     UC Treatments / Results  Labs (all labs ordered are listed, but only abnormal results are displayed) Labs Reviewed  CERVICOVAGINAL ANCILLARY ONLY    EKG   Radiology No results found.  Procedures Procedures (including critical care time)  Medications Ordered in UC Medications - No data to display  Initial Impression / Assessment and Plan / UC Course  I have reviewed the triage vital signs and the nursing notes.  Pertinent labs & imaging results that were available during my care of the patient were reviewed by me and considered in my medical decision making (see chart for  details).    25 year old female presents with vaginal discharge.  Based on history, I am treating her empirically for yeast vaginitis with Diflucan.  Awaiting swab results.  Final Clinical Impressions(s) / UC Diagnoses   Final diagnoses:  Vaginal discharge     Discharge Instructions      Medication as prescribed for yeast vaginitis.  Results should be available in the next few days.  Take care  Dr. Adriana Simas    ED Prescriptions     Medication Sig Dispense Auth. Provider   fluconazole (DIFLUCAN) 150 MG tablet Take 1 tablet (150 mg total) by mouth once for 1 dose. Repeat dose in 72 hours. 2 tablet Tommie Sams, DO      PDMP not reviewed this encounter.   Tommie Sams, [place] 12/27/20 [phone]

## 2020-12-27 NOTE — ED Triage Notes (Signed)
Pt c/o vaginal itching for about 2 days. Pt also reports some discharge yesterday, thick and white, denies any continued discharge today. Pt denies any urinary symptoms.

## 2020-12-27 NOTE — Discharge Instructions (Addendum)
Medication as prescribed for yeast vaginitis.  Results should be available in the next few days.  Take care  Dr. Adriana Simas

## 2020-12-29 LAB — CERVICOVAGINAL ANCILLARY ONLY
Bacterial Vaginitis (gardnerella): NEGATIVE
Candida Glabrata: NEGATIVE
Candida Vaginitis: POSITIVE — AB
Chlamydia: NEGATIVE
Comment: NEGATIVE
Comment: NEGATIVE
Comment: NEGATIVE
Comment: NEGATIVE
Comment: NEGATIVE
Comment: NORMAL
Neisseria Gonorrhea: NEGATIVE
Trichomonas: NEGATIVE

## 2021-02-12 ENCOUNTER — Telehealth: Payer: Self-pay | Admitting: Emergency Medicine

## 2021-02-12 NOTE — Telephone Encounter (Signed)
Pt called in regards to billing of her vaginal swab done on 12/27/20. She state she received a bill for $250 and states she never got results from the test and does not feel she needs to pay for this. Pt states she called the billing company and she was told to call [facility] about this. Advised patient that she was treated for yeast based on her symptoms clinically and her PMH of this. When the results came back pt had been treated for yeast and that was the only test that was positive so pt did not get a phone call. Advised pt that this was our procedure with labs. Pt was upset and hung up the phone on me.

## 2022-01-25 ENCOUNTER — Ambulatory Visit: Payer: Medicaid Other | Admitting: Advanced Practice Midwife

## 2022-01-25 ENCOUNTER — Encounter: Payer: Self-pay | Admitting: Advanced Practice Midwife

## 2022-01-25 VITALS — BP 119/78 | HR 92 | Temp 97.2°F | Wt 230.0 lb

## 2022-01-25 DIAGNOSIS — O99341 Other mental disorders complicating pregnancy, first trimester: Secondary | ICD-10-CM | POA: Diagnosis not present

## 2022-01-25 DIAGNOSIS — O99340 Other mental disorders complicating pregnancy, unspecified trimester: Secondary | ICD-10-CM

## 2022-01-25 DIAGNOSIS — Z3402 Encounter for supervision of normal first pregnancy, second trimester: Secondary | ICD-10-CM | POA: Insufficient documentation

## 2022-01-25 DIAGNOSIS — Z3481 Encounter for supervision of other normal pregnancy, first trimester: Secondary | ICD-10-CM

## 2022-01-25 DIAGNOSIS — B379 Candidiasis, unspecified: Secondary | ICD-10-CM

## 2022-01-25 DIAGNOSIS — O99211 Obesity complicating pregnancy, first trimester: Secondary | ICD-10-CM | POA: Diagnosis not present

## 2022-01-25 DIAGNOSIS — O99011 Anemia complicating pregnancy, first trimester: Secondary | ICD-10-CM

## 2022-01-25 DIAGNOSIS — A609 Anogenital herpesviral infection, unspecified: Secondary | ICD-10-CM

## 2022-01-25 DIAGNOSIS — O99019 Anemia complicating pregnancy, unspecified trimester: Secondary | ICD-10-CM | POA: Insufficient documentation

## 2022-01-25 DIAGNOSIS — F32A Depression, unspecified: Secondary | ICD-10-CM

## 2022-01-25 DIAGNOSIS — O99210 Obesity complicating pregnancy, unspecified trimester: Secondary | ICD-10-CM

## 2022-01-25 DIAGNOSIS — O0993 Supervision of high risk pregnancy, unspecified, third trimester: Secondary | ICD-10-CM | POA: Insufficient documentation

## 2022-01-25 LAB — URINALYSIS
Bilirubin, UA: NEGATIVE
Glucose, UA: NEGATIVE
Nitrite, UA: NEGATIVE
RBC, UA: NEGATIVE
Specific Gravity, UA: 1.02 (ref 1.005–1.030)
Urobilinogen, Ur: 1 mg/dL (ref 0.2–1.0)
pH, UA: 7 (ref 5.0–7.5)

## 2022-01-25 LAB — WET PREP FOR TRICH, YEAST, CLUE: Trichomonas Exam: NEGATIVE

## 2022-01-25 LAB — HEMOGLOBIN, FINGERSTICK: Hemoglobin: 10.9 g/dL — ABNORMAL LOW (ref 11.1–15.9)

## 2022-01-25 MED ORDER — CLOTRIMAZOLE 1 % VA CREA: 1.0000 | TOPICAL_CREAM | Freq: Every day | VAGINAL | 0 refills | Status: AC

## 2022-01-25 MED ORDER — FERROUS SULFATE 325 (65 FE) MG PO TABS: 325.0000 mg | ORAL_TABLET | Freq: Every day | ORAL | 3 refills | Status: AC

## 2022-01-25 NOTE — Progress Notes (Signed)
Patient here for IP at [redacted]w[redacted]d.   [facility] [place] referral faxed today with confirmation.   Urinalysis reviewed during clinic visit - no treatment indicated.   Wet mount reviewed - treatment given for yeast per SO.   Hgb reviewed during visit.  Per standing order: Dispensed ferrous sulfate 325mg  and given to patient.  Instructed to take one tablet daily with juice that has Vit C such as orange, apple, or grape juice.  Anemia panel order added.  Anemia in pregnancy added to problem list. Counsel patient on Fe-rich foods and Anemia in Pregnancy Pamphlet given.   Patient verbalized understanding. Questions answered.   , RN

## 2022-01-25 NOTE — Progress Notes (Signed)
[facility]  Maternal Health Clinic   INITIAL PRENATAL VISIT NOTE  Subjective:  Nancy Mcfarland is a 26 y.o. SBF exvaper G2P0010 at [redacted]w[redacted]d being seen today to start prenatal care at the [facility].  She feels "nervous" about surprise pregnancy with no birth control. 26 yo employed FOB feels "good" about pregnancy; in supportive 9 year relationship; he has no children. She is employed 40 hrs/wk and living with her Dad.  LMP 11/10/21. Has had 1 u/s this pregnancy on 01/12/22 with +FHR documented only. Been to ER x2 this pregnancy (12/18/21 and 01/14/22.  Last dental exam 07/2021. Last vaped 3 years ago. Last cigar 8 years ago. Last MJ 11/2021. Last ETOH 11/04/21 (10 shots liquor+ 3 Margaritas). Denies cigs. Taking Lexapro 20 mg daily, Buspar 5 mg BID, and was referred to therapist for counseling. C/o N&V this am and last night; only ate an apple this am. She is currently monitored for the following issues for this low-risk pregnancy and has Obesity affecting pregnancy BMI=43.5; HSV (herpes simplex virus) +11/13/2015; Depression affecting pregnancy; and Prenatal care, subsequent pregnancy, first trimester on their problem list.  Patient reports  N&V .  Contractions: Not present. Vag. Bleeding: None.  Movement: Absent. Denies leaking of fluid.   Indications for ASA therapy (per uptodate) One of the following: Previous pregnancy with preeclampsia, especially early onset and with an adverse outcome No Multifetal gestation No Chronic hypertension No Type 1 or 2 diabetes mellitus No Chronic kidney disease No Autoimmune disease (antiphospholipid syndrome, systemic lupus erythematosus) No  Two or more of the following: Nulliparity No Obesity (body mass index >30 kg/m2) Yes Family history of preeclampsia in mother or sister No Age ?35 years No Sociodemographic characteristics (African American race, low socioeconomic level) Yes Personal risk factors (eg, previous  pregnancy with low birth weight or small for gestational age infant, previous adverse pregnancy outcome [eg, stillbirth], interval >10 years between pregnancies) No   The following portions of the patient's history were reviewed and updated as appropriate: allergies, current medications, past family history, past medical history, past social history, past surgical history and problem list. Problem list updated.  Objective:   Vitals:   01/25/22 0829  BP: 119/78  Pulse: 92  Temp: (!) 97.2 F (36.2 C)  Weight: 230 lb (104.3 kg)    Fetal Status: Fetal Heart Rate (bpm): 160 Fundal Height: 12 cm Movement: Absent  Presentation: Undeterminable   Physical Exam Vitals and nursing note reviewed.  Constitutional:      General: She is not in acute distress.    Appearance: Normal appearance. She is well-developed. She is obese.  HENT:     Head: Normocephalic and atraumatic.     Right Ear: External ear normal.     Left Ear: External ear normal.     Nose: Nose normal. No congestion or rhinorrhea.     Mouth/Throat:     Lips: Pink.     Mouth: Mucous membranes are moist.     Dentition: Normal dentition. No dental caries.     Pharynx: Oropharynx is clear. Uvula midline.     Comments: Dentition: good, last dental apt 07/2021 Eyes:     General: No scleral icterus.    Conjunctiva/sclera: Conjunctivae normal.  Neck:     Thyroid: No thyroid mass, thyromegaly or thyroid tenderness.  Cardiovascular:     Rate and Rhythm: Normal rate.     Pulses: Normal pulses.     Comments: Extremities are warm and well  perfused Pulmonary:     Effort: Pulmonary effort is normal.     Breath sounds: Normal breath sounds.  Chest:     Chest wall: No mass.  Breasts:    Tanner Score is 5.     Breasts are symmetrical.     Right: Normal. No mass, nipple discharge or skin change.     Left: Normal. No mass, nipple discharge or skin change.  Abdominal:     Palpations: Abdomen is soft.     Tenderness: There is no  abdominal tenderness.     Comments: Gravid,soft without masses or tenderness, increased adipose FH difficult to assess due to increased adipoze, FHR=160  Genitourinary:    General: Normal vulva.     Exam position: Lithotomy position.     Pubic Area: No rash.      Labia:        Right: No rash.        Left: No rash.      Vagina: Vaginal discharge (white creamy leukorrhea, ph<4.5) present.     Cervix: Normal.     Uterus: Normal. Enlarged (Gravid difficult to assess due to increased adipose). Not tender.      Rectum: Normal. No external hemorrhoid.  Musculoskeletal:     Right lower leg: No edema.     Left lower leg: No edema.  Lymphadenopathy:     Cervical: No cervical adenopathy.     Upper Body:     Right upper body: No axillary adenopathy.     Left upper body: No axillary adenopathy.  Skin:    General: Skin is warm.     Capillary Refill: Capillary refill takes less than 2 seconds.  Neurological:     Mental Status: She is alert.    Assessment and Plan:  Pregnancy: G2P0010 at [redacted]w[redacted]d  1. Obesity affecting pregnancy, antepartum Counseled on weight gain of 11-20 lbs this pregnancy Agrees to ASA 81 mg daily  2. HSV (herpes simplex virus) +11/13/2015 Will need suppressive therapy 36 wks until birth  3. Depression affecting pregnancy dx'd 06/2021 Taking Lexapro and Buspar To begin therapy 02/2022 PHQ-9=9  4. Prenatal care, subsequent pregnancy, first trimester U/s ordered for dating Desires NIPS  - HIV-1/HIV-2 Qualitative RNA - Prenatal profile without Varicella/Rubella (027253) - HCV Ab w Reflex to Quant PCR - Urine Culture - Chlamydia/GC NAA, Confirmation - Glucose, 1 hour gestational - Hgb A1c w/o eAG - Comprehensive metabolic panel - Protein / creatinine ratio, urine  (Spot) - TSH - Hemoglobinopathy evaluation -664403 - MaterniT21 PLUS Core - 474259 Drug Screen - WET PREP FOR TRICH, YEAST, CLUE - Urinalysis (Urine Dip) - Hemoglobin, venipuncture    Discussed  overview of care and coordination with inpatient delivery practices including [facility], [facility], [facility] and [facility].   Reviewed Centering pregnancy as standard of care at [facility]    Preterm labor symptoms and general obstetric precautions including but not limited to vaginal bleeding, contractions, leaking of fluid and fetal movement were reviewed in detail with the patient.  Please refer to After Visit Summary for other counseling recommendations.   No follow-ups on file.  No future appointments.  Alberteen Spindle, CNM

## 2022-01-26 LAB — 789231 7+OXYCODONE-BUND
Amphetamines, Urine: NEGATIVE ng/mL
BENZODIAZ UR QL: NEGATIVE ng/mL
Barbiturate screen, urine: NEGATIVE ng/mL
Cannabinoid Quant, Ur: NEGATIVE ng/mL
Cocaine (Metab.): NEGATIVE ng/mL
OPIATE SCREEN URINE: NEGATIVE ng/mL
Oxycodone/Oxymorphone, Urine: NEGATIVE ng/mL
PCP Quant, Ur: NEGATIVE ng/mL

## 2022-01-26 LAB — FE+CBC/D/PLT+TIBC+FER+RETIC
Basophils Absolute: 0 10*3/uL (ref 0.0–0.2)
Basos: 0 %
EOS (ABSOLUTE): 0 10*3/uL (ref 0.0–0.4)
Eos: 0 %
Ferritin: 57 ng/mL (ref 15–150)
Hematocrit: 34.9 % (ref 34.0–46.6)
Hemoglobin: 10.9 g/dL — ABNORMAL LOW (ref 11.1–15.9)
Immature Grans (Abs): 0 10*3/uL (ref 0.0–0.1)
Immature Granulocytes: 0 %
Iron Saturation: 19 % (ref 15–55)
Iron: 57 ug/dL (ref 27–159)
Lymphocytes Absolute: 1.9 10*3/uL (ref 0.7–3.1)
Lymphs: 24 %
MCH: 25 pg — ABNORMAL LOW (ref 26.6–33.0)
MCHC: 31.2 g/dL — ABNORMAL LOW (ref 31.5–35.7)
MCV: 80 fL (ref 79–97)
Monocytes Absolute: 0.5 10*3/uL (ref 0.1–0.9)
Monocytes: 6 %
Neutrophils Absolute: 5.3 10*3/uL (ref 1.4–7.0)
Neutrophils: 70 %
Platelets: 317 10*3/uL (ref 150–450)
RBC: 4.36 x10E6/uL (ref 3.77–5.28)
RDW: 15.6 % — ABNORMAL HIGH (ref 11.7–15.4)
Retic Ct Pct: 1.4 % (ref 0.6–2.6)
Total Iron Binding Capacity: 299 ug/dL (ref 250–450)
UIBC: 242 ug/dL (ref 131–425)
WBC: 7.7 10*3/uL (ref 3.4–10.8)

## 2022-01-26 LAB — PROTEIN / CREATININE RATIO, URINE
Creatinine, Urine: 262.2 mg/dL
Protein, Ur: 22.3 mg/dL
Protein/Creat Ratio: 85 mg/g creat (ref 0–200)

## 2022-01-27 LAB — URINE CULTURE

## 2022-01-29 ENCOUNTER — Telehealth: Payer: Self-pay

## 2022-01-29 LAB — CHLAMYDIA/GC NAA, CONFIRMATION
Chlamydia trachomatis, NAA: NEGATIVE
Neisseria gonorrhoeae, NAA: NEGATIVE

## 2022-01-29 NOTE — Telephone Encounter (Signed)
Copied and Pasted Epic message by Arnetha Courser, CNM 01-29-2022.  Message Received: Today Sciora, Austin Miles, CNM  [facility] Needs UTI tx with Macrobid 100 mg po BID x 7 days  Has Medicaid so e-rx'd . Please call pt and tell to pick up tx from pharmacy  Telephone call to patient today regarding the need for UTI Tx.  Left a message for patient to return the call to the maternity nurses at [phone] regarding medication pick up.  Hart Carwin, RN

## 2022-01-30 LAB — GLUCOSE, 1 HOUR GESTATIONAL: Gestational Diabetes Screen: 90 mg/dL (ref 70–139)

## 2022-01-30 LAB — MATERNIT 21 PLUS CORE, BLOOD
Fetal Fraction: 7
Result (T21): NEGATIVE
Trisomy 13 (Patau syndrome): NEGATIVE
Trisomy 18 (Edwards syndrome): NEGATIVE
Trisomy 21 (Down syndrome): NEGATIVE

## 2022-01-30 LAB — CBC/D/PLT+RPR+RH+ABO+AB SCR
Antibody Screen: NEGATIVE
Basophils Absolute: 0 10*3/uL (ref 0.0–0.2)
Basos: 0 %
EOS (ABSOLUTE): 0 10*3/uL (ref 0.0–0.4)
Eos: 0 %
Hematocrit: 34.3 % (ref 34.0–46.6)
Hemoglobin: 11 g/dL — ABNORMAL LOW (ref 11.1–15.9)
Hepatitis B Surface Ag: NEGATIVE
Immature Grans (Abs): 0 10*3/uL (ref 0.0–0.1)
Immature Granulocytes: 0 %
Lymphocytes Absolute: 2 10*3/uL (ref 0.7–3.1)
Lymphs: 26 %
MCH: 25.3 pg — ABNORMAL LOW (ref 26.6–33.0)
MCHC: 32.1 g/dL (ref 31.5–35.7)
MCV: 79 fL (ref 79–97)
Monocytes Absolute: 0.4 10*3/uL (ref 0.1–0.9)
Monocytes: 5 %
Neutrophils Absolute: 5.3 10*3/uL (ref 1.4–7.0)
Neutrophils: 69 %
Platelets: 321 10*3/uL (ref 150–450)
RBC: 4.35 x10E6/uL (ref 3.77–5.28)
RDW: 15.2 % (ref 11.7–15.4)
RPR Ser Ql: NONREACTIVE
Rh Factor: POSITIVE
WBC: 7.7 10*3/uL (ref 3.4–10.8)

## 2022-01-30 LAB — COMPREHENSIVE METABOLIC PANEL
ALT: 6 IU/L (ref 0–32)
AST: 15 IU/L (ref 0–40)
Albumin/Globulin Ratio: 1.3 (ref 1.2–2.2)
Albumin: 3.8 g/dL — ABNORMAL LOW (ref 4.0–5.0)
Alkaline Phosphatase: 67 IU/L (ref 44–121)
BUN/Creatinine Ratio: 10 (ref 9–23)
BUN: 6 mg/dL (ref 6–20)
Bilirubin Total: <0.2 mg/dL (ref 0.0–1.2)
CO2: 19 mmol/L — ABNORMAL LOW (ref 20–29)
Calcium: 9.2 mg/dL (ref 8.7–10.2)
Chloride: 102 mmol/L (ref 96–106)
Creatinine, Ser: 0.59 mg/dL (ref 0.57–1.00)
Globulin, Total: 2.9 g/dL (ref 1.5–4.5)
Glucose: 97 mg/dL (ref 70–99)
Potassium: 4.2 mmol/L (ref 3.5–5.2)
Sodium: 136 mmol/L (ref 134–144)
Total Protein: 6.7 g/dL (ref 6.0–8.5)
eGFR: 128 mL/min/{1.73_m2} (ref >59)

## 2022-01-30 LAB — HGB FRACTIONATION CASCADE
Hgb A2: 3.1 % (ref 1.8–3.2)
Hgb A: 96.9 % (ref 96.4–98.8)
Hgb F: 0 % (ref 0.0–2.0)
Hgb S: 0 %

## 2022-01-30 LAB — HIV-1/HIV-2 QUALITATIVE RNA
HIV-1 RNA, Qualitative: NONREACTIVE
HIV-2 RNA, Qualitative: NONREACTIVE

## 2022-01-30 LAB — HGB A1C W/O EAG: Hgb A1c MFr Bld: 5.6 % (ref 4.8–5.6)

## 2022-01-30 LAB — HCV AB W REFLEX TO QUANT PCR: HCV Ab: NONREACTIVE

## 2022-01-30 LAB — TSH: TSH: 1.03 u[IU]/mL (ref 0.450–4.500)

## 2022-01-30 LAB — HCV INTERPRETATION

## 2022-01-30 NOTE — Telephone Encounter (Signed)
Telephone call to patient today regarding the need for UTI Tx.  Patient plans to pick up her antibiotic today and start taking it today 01-30-2022.  Encouraged her to eat yogurt every day while taking her antibiotic to keep her gut healthy.  Hart Carwin, RN

## 2022-01-30 NOTE — Telephone Encounter (Signed)
Text sent to contact phone asking patient to please call me at [phone].  Hart Carwin, RN

## 2022-01-30 NOTE — Telephone Encounter (Signed)
Return call by patient regarding the UTI Tx medication information.  Explained to patient the situation of the error and that it has been corrected in her chart and that she does not have an abnormal lab nor a UTI and DOES NOT need treatment.  She completely understood and thanked me for the phone call.  Hart Carwin, RN

## 2022-01-30 NOTE — Telephone Encounter (Signed)
MyChart message sent to patient today asking her to please call me at [phone].  Last patient login 01-29-2022. Hart Carwin, RN

## 2022-01-30 NOTE — Telephone Encounter (Signed)
Copied and Pasted Epic message by Arnetha Courser, CM 01-30-2022:  Message Received: Today Sciora, Austin Miles, CNM  Hart Carwin, RN Hi Nancy Mcfarland, I looked at this pt's C&S and she does not have a UTI and doesn't need antibiotics. If you look at her lab, I wrote there was an error on my part because the computer skipped and I didn't see the name so I cancelled it. She does not need antibiotics. Sorry   Telephone call to patient today regarding update in medication needed for UTI Tx.  Left message for patient to return my call regarding the medication we spoke about earlier today.  Please call [phone].  Hart Carwin, RN

## 2022-02-11 NOTE — Addendum Note (Signed)
Addended by: Ajani Schnieders on: 02/11/2022 11:06 AM   Modules accepted: Orders  

## 2022-02-19 ENCOUNTER — Encounter: Payer: Self-pay | Admitting: Advanced Practice Midwife

## 2022-02-22 ENCOUNTER — Ambulatory Visit: Payer: BC Managed Care – PPO | Admitting: Advanced Practice Midwife

## 2022-02-22 VITALS — BP 114/81 | HR 103 | Temp 97.2°F | Wt 228.0 lb

## 2022-02-22 DIAGNOSIS — O99340 Other mental disorders complicating pregnancy, unspecified trimester: Secondary | ICD-10-CM

## 2022-02-22 DIAGNOSIS — O99210 Obesity complicating pregnancy, unspecified trimester: Secondary | ICD-10-CM

## 2022-02-22 DIAGNOSIS — O99012 Anemia complicating pregnancy, second trimester: Secondary | ICD-10-CM | POA: Diagnosis not present

## 2022-02-22 DIAGNOSIS — O99342 Other mental disorders complicating pregnancy, second trimester: Secondary | ICD-10-CM | POA: Diagnosis not present

## 2022-02-22 DIAGNOSIS — Z3482 Encounter for supervision of other normal pregnancy, second trimester: Secondary | ICD-10-CM

## 2022-02-22 DIAGNOSIS — O99011 Anemia complicating pregnancy, first trimester: Secondary | ICD-10-CM

## 2022-02-22 DIAGNOSIS — O99212 Obesity complicating pregnancy, second trimester: Secondary | ICD-10-CM | POA: Diagnosis not present

## 2022-02-22 DIAGNOSIS — Z3481 Encounter for supervision of other normal pregnancy, first trimester: Secondary | ICD-10-CM

## 2022-02-22 LAB — HEMOGLOBIN, FINGERSTICK: Hemoglobin: 10.7 g/dL — ABNORMAL LOW (ref 11.1–15.9)

## 2022-02-22 NOTE — Progress Notes (Addendum)
Kept early September 2023 [facility] [place] appt. Per client, her work insurance becomes effective next week and she plans a transfer of care to [facility]. Records faxed today to [facility] per client request and fax confirmation received. (Previously signed ROI). Correctly verbalizes how to take PNV and iron tablet. Taking iron tablet with juice. Counseled to take 81 mg aspirin as counseled by provider at initial appt. Rich Number, RN Hgb = 10.7 and to continue QD iron . Rich Number, RN

## 2022-02-22 NOTE — Progress Notes (Signed)
[place] [facility]  PRENATAL VISIT NOTE  Subjective:  Nancy Mcfarland is a 26 y.o. G2P0010 at [redacted]w[redacted]d being seen today for ongoing prenatal care.  She is currently monitored for the following issues for this low-risk pregnancy and has Obesity affecting pregnancy BMI=43.5; HSV (herpes simplex virus) +11/13/2015; Depression affecting pregnancy dx'd 06/2021; Prenatal care, subsequent pregnancy, first trimester; and Anemia affecting pregnancy on their problem list.  Patient reports no complaints.  Contractions: Not present. Vag. Bleeding: None.  Movement: Absent. Denies leaking of fluid/ROM.   The following portions of the patient's history were reviewed and updated as appropriate: allergies, current medications, past family history, past medical history, past social history, past surgical history and problem list. Problem list updated.  Objective:   Vitals:   02/22/22 0904  BP: 114/81  Pulse: (!) 103  Temp: (!) 97.2 F (36.2 C)  Weight: 228 lb (103.4 kg)    Fetal Status: Fetal Heart Rate (bpm): 155 Fundal Height: 16 cm Movement: Absent     General:  Alert, oriented and cooperative. Patient is in no acute distress.  Skin: Skin is warm and dry. No rash noted.   Cardiovascular: Normal heart rate noted  Respiratory: Normal respiratory effort, no problems with respiration noted  Abdomen: Soft, gravid, appropriate for gestational age.  Pain/Pressure: Absent     Pelvic: Cervical exam deferred        Extremities: Normal range of motion.  Edema: None  Mental Status: Normal mood and affect. Normal behavior. Normal judgment and thought content.   Assessment and Plan:  Pregnancy: G2P0010 at [redacted]w[redacted]d  1. Prenatal care, subsequent pregnancy, first trimester Working 40 hrs/wk NIPS=neg 01/25/22 Transferring care to [facility] next week Reviewed 02/05/22 u/s at 12 4/7  - Hemoglobin, venipuncture  3. Anemia affecting pregnancy in first trimester Taking FeSo4 I daily with  apple juice Denies ice pica Pamphlet given of iron rich foods  4. Obesity affecting pregnancy, antepartum -18 lb (-8.165 kg) Hasn't bought ASA 81 mg yet--wants to buy today and begin taking   5. Depression affecting pregnancy dx'd 06/2021 Stopped taking Lexapro 20 mg and Buspar 5 mg BID on 02/06/22 and has been feeling depressed and isolating at home States hasn't made apt yet with counselor through primary care MD but promises to do so to discuss resuming antidepressants   Preterm labor symptoms and general obstetric precautions including but not limited to vaginal bleeding, contractions, leaking of fluid and fetal movement were reviewed in detail with the patient. Please refer to After Visit Summary for other counseling recommendations.  No follow-ups on file.  No future appointments.  Herbie Saxon, CNM

## 2022-02-27 ENCOUNTER — Telehealth: Payer: Self-pay

## 2022-02-27 NOTE — Telephone Encounter (Signed)
Patient called complaining of white vaginal discharge and itching and would like to be seen.  States she thinks she might have a UTI. Patient states no burning with urination, occasional urgency. Patient states discharge started on 02/24/22 evening. Patient was seen in clinic on 02/22/22, but states she did not have symptoms at that time. Patient schedule for next available appointment tomorrow, counseled to arrive at 1:00 pm. Patient states understanding and agreement with plan.Jenetta Downer, RN

## 2022-02-28 ENCOUNTER — Encounter: Payer: Self-pay | Admitting: Physician Assistant

## 2022-02-28 ENCOUNTER — Ambulatory Visit: Payer: BC Managed Care – PPO | Admitting: Physician Assistant

## 2022-02-28 VITALS — BP 113/79 | HR 87 | Temp 97.0°F | Wt 226.8 lb

## 2022-02-28 DIAGNOSIS — Z3482 Encounter for supervision of other normal pregnancy, second trimester: Secondary | ICD-10-CM | POA: Diagnosis not present

## 2022-02-28 DIAGNOSIS — Z3481 Encounter for supervision of other normal pregnancy, first trimester: Secondary | ICD-10-CM

## 2022-02-28 LAB — WET PREP FOR TRICH, YEAST, CLUE: Trichomonas Exam: NEGATIVE

## 2022-02-28 MED ORDER — CLOTRIMAZOLE 1 % VA CREA: 1.0000 | TOPICAL_CREAM | Freq: Every day | VAGINAL | 0 refills | Status: AC

## 2022-02-28 NOTE — Progress Notes (Signed)
[place] [facility]  PRENATAL VISIT NOTE  Subjective:  Nancy Mcfarland is a 26 y.o. G2P0010 at [redacted]w[redacted]d being seen today for ongoing prenatal care acute visit.  She is currently monitored for the following issues for this high-risk pregnancy and has Obesity affecting pregnancy BMI=43.5; HSV (herpes simplex virus) +11/13/2015; Depression affecting pregnancy dx'd 06/2021; Encounter for supervision of normal first pregnancy in second trimester; and Anemia affecting pregnancy on their problem list.  Patient reports  vaginal itching for 3 days .  Contractions: Not present. Vag. Bleeding: None.  Movement: Absent. Sx are similar to those she was treated for at initial prenatal visit when she was given a yeast cream and sx resolved. Denies leaking of fluid/ROM.   The following portions of the patient's history were reviewed and updated as appropriate: allergies, current medications, past family history, past medical history, past social history, past surgical history and problem list. Problem list updated.  Objective:   Vitals:   02/28/22 1317  BP: 113/79  Pulse: 87  Temp: (!) 97 F (36.1 C)  Weight: 226 lb 12.8 oz (102.9 kg)    Fetal Status: Fetal Heart Rate (bpm): 156 Fundal Height: 17 cm Movement: Absent     General:  Alert, oriented and cooperative. Patient is in no acute distress.  Skin: Skin is warm and dry. No rash noted.   Cardiovascular: Normal heart rate noted  Respiratory: Normal respiratory effort, no problems with respiration noted  Abdomen: Soft, gravid, appropriate for gestational age.  Pain/Pressure: Absent     Pelvic: Cervical exam deferred        Extremities: Normal range of motion.  Edema: None  Mental Status: Normal mood and affect. Normal behavior. Normal judgment and thought content.   Assessment and Plan:  Pregnancy: G2P0010 at [redacted]w[redacted]d  1. Prenatal care, subsequent pregnancy, first trimester GC/chlam testing done at first visit  reviewed/neg. Wet prep today = yeast, treat per S.O. - AFP, Serum, Open Spina Bifida - WET PREP FOR TRICH, YEAST, CLUE   Preterm labor symptoms and general obstetric precautions including but not limited to vaginal bleeding, contractions, leaking of fluid and fetal movement were reviewed in detail with the patient. Please refer to After Visit Summary for other counseling recommendations.  Return for as scheduled, Routine prenatal care.  Future Appointments  Date Time Provider [place]  03/22/2022  9:00 AM [facility] PROVIDER [facility] None    Lora Havens, PA-C

## 2022-02-28 NOTE — Progress Notes (Signed)
Plans transfer of care to [facility] when work provides her with insurance card. [facility] OB problem visit today due to vaginal itching and discharge. Aware of next [facility] appt on 03/22/2022 with arrival time of 0845. Client states plans to deliver at [facility] with [facility] even if continues prenatal care at [place]. Kept 02/05/2022 [facility] [place] appt.  Correctly verbalizes how to take iron and prenatal vitamin. Taking iron with juice. Rich Number, RN Wet prep reviewed and treated for moderate yeast per standing order. Rich Number, RN

## 2022-03-02 LAB — AFP, SERUM, OPEN SPINA BIFIDA
AFP MoM: 1.3
AFP Value: 35 ng/mL
Gest. Age on Collection Date: 15.5 weeks
Maternal Age At EDD: 26.5 yr
OSBR Risk 1 IN: 9606
Test Results:: NEGATIVE
Weight: 227 [lb_av]

## 2022-03-22 ENCOUNTER — Ambulatory Visit: Payer: Medicaid Other

## 2022-03-22 ENCOUNTER — Telehealth: Payer: Self-pay

## 2022-03-22 NOTE — Telephone Encounter (Signed)
[facility] for [facility] 03/22/2022. Call to client and left message requesting she reschedule missed appt. Number to call provided. Rich Number, RN

## 2022-03-25 NOTE — Telephone Encounter (Signed)
Call to client to reschedule missed [facility] appt from 03/22/2022. Left message to call regarding above and number to call provided. Rich Number, RN

## 2022-03-29 NOTE — Telephone Encounter (Signed)
Per Glenard Haring at [facility], client has new OB appt at practice on 04/29/2022 with Lucrezia Europe, CNM.  Call to client and left message to call and reschedule missed [facility] appt (one month until new OB appt at [facility]). Number to call provided.  Call to emergency contact and requested assistance contacting client to call [facility] regarding an appt prior to her first appt with [facility]. Per father, he will give her the message. Rich Number, RN

## 2022-04-09 NOTE — Telephone Encounter (Signed)
As no response from client regarding [facility] appt prior to her 04/29/22 self transfer of care appt at [facility], phone encounter closed. Rich Number, RN

## 2022-05-30 ENCOUNTER — Other Ambulatory Visit: Payer: Self-pay | Admitting: Certified Nurse Midwife

## 2022-05-30 DIAGNOSIS — D509 Iron deficiency anemia, unspecified: Secondary | ICD-10-CM

## 2022-05-30 DIAGNOSIS — O99013 Anemia complicating pregnancy, third trimester: Secondary | ICD-10-CM

## 2022-05-30 NOTE — Progress Notes (Signed)
26 year old G2P0010 at [redacted]w[redacted]d with anemia in pregnancy. Hgb 9.7 at 28wks. Venofer 300mg  IV weekly x 3 ordered. Recheck Hgb @ 32wks.  , CNM

## 2022-05-31 ENCOUNTER — Ambulatory Visit
Admission: RE | Admit: 2022-05-31 | Discharge: 2022-05-31 | Disposition: A | Discharge status: Home / Self Care | Payer: Medicaid Other | Source: Ambulatory Visit | Attending: Certified Nurse Midwife | Admitting: Certified Nurse Midwife

## 2022-05-31 DIAGNOSIS — D509 Iron deficiency anemia, unspecified: Secondary | ICD-10-CM | POA: Diagnosis not present

## 2022-05-31 DIAGNOSIS — O99013 Anemia complicating pregnancy, third trimester: Secondary | ICD-10-CM | POA: Insufficient documentation

## 2022-05-31 MED ORDER — SODIUM CHLORIDE 0.9 % IV SOLN
300.0000 mg | INTRAVENOUS | Status: DC
  Administered 2022-05-31: 300 mg via INTRAVENOUS
  Filled 2022-05-31: qty 300

## 2022-06-03 NOTE — L&D Delivery Note (Signed)
Delivery Note  First Stage: Labor onset: 08/22/22 @ 1530 Augmentation : cytotec, pitocin Analgesia Lorriane Shire intrapartum: epidural  SROM at 08/22/22 @ 1837  Second Stage: Complete dilation at 1120 Onset of pushing at 1102 (anterior lip that is reducible) FHR second stage 135bpm, moderate variability, accelerations present, intermittent variable decelerations  Delivery of a viable female infant 08/23/2022 at 1214 by Lucrezia Europe, CNM. delivery of fetal head in OA position with restitution to ROT. Loose nuchal cord x1;  Anterior then posterior shoulders delivered easily with gentle downward traction. Baby placed on mom's chest, and attended to by peds.  Cord double clamped after cessation of pulsation, cut by FOB  Third Stage: Placenta delivered Delena Bali intact with 3 VC @ 1224 Trailing membranes removed easily with ring forceps Placenta disposition: discarded Uterine tone firm / bleeding light  2nd degree perineal laceration identified  Anesthesia for repair: epidural Repair 2-0 Vicryl Est. Blood Loss (mL): [id number]  Complications: none  Mom to postpartum.  Baby to Couplet care / Skin to Skin.  Newborn: Birth Weight: TBD, infant skin-to-skin  Apgar Scores: 8, 9 Feeding planned: breast and bottle

## 2022-06-05 ENCOUNTER — Other Ambulatory Visit: Payer: BC Managed Care – PPO

## 2022-06-07 ENCOUNTER — Ambulatory Visit
Admission: RE | Admit: 2022-06-07 | Discharge: 2022-06-07 | Disposition: A | Discharge status: Home / Self Care | Payer: BC Managed Care – PPO | Source: Ambulatory Visit | Attending: Certified Nurse Midwife | Admitting: Certified Nurse Midwife

## 2022-06-07 DIAGNOSIS — O99013 Anemia complicating pregnancy, third trimester: Secondary | ICD-10-CM | POA: Diagnosis present

## 2022-06-07 DIAGNOSIS — D509 Iron deficiency anemia, unspecified: Secondary | ICD-10-CM | POA: Insufficient documentation

## 2022-06-07 MED ORDER — SODIUM CHLORIDE 0.9 % IV SOLN
300.0000 mg | Freq: Once | INTRAVENOUS | Status: AC
  Administered 2022-06-07: 300 mg via INTRAVENOUS
  Filled 2022-06-07: qty 300

## 2022-06-13 ENCOUNTER — Ambulatory Visit
Admission: RE | Admit: 2022-06-13 | Discharge: 2022-06-13 | Disposition: A | Discharge status: Home / Self Care | Payer: BC Managed Care – PPO | Source: Ambulatory Visit | Attending: Family Medicine | Admitting: Family Medicine

## 2022-06-13 DIAGNOSIS — D509 Iron deficiency anemia, unspecified: Secondary | ICD-10-CM | POA: Insufficient documentation

## 2022-06-13 DIAGNOSIS — Z3A00 Weeks of gestation of pregnancy not specified: Secondary | ICD-10-CM | POA: Diagnosis not present

## 2022-06-13 DIAGNOSIS — O99013 Anemia complicating pregnancy, third trimester: Secondary | ICD-10-CM | POA: Diagnosis not present

## 2022-06-13 MED ORDER — SODIUM CHLORIDE 0.9 % IV SOLN
300.0000 mg | Freq: Once | INTRAVENOUS | Status: AC
  Administered 2022-06-13: 300 mg via INTRAVENOUS
  Filled 2022-06-13: qty 300

## 2022-07-08 NOTE — Addendum Note (Signed)
Encounter addended by: Kathyrn Drown, RN on: 07/08/2022 8:12 AM  Actions taken: Charge Capture section accepted

## 2022-07-16 ENCOUNTER — Inpatient Hospital Stay: Admission: RE | Admit: 2022-07-16 | Payer: BC Managed Care – PPO | Source: Ambulatory Visit

## 2022-07-22 LAB — OB RESULTS CONSOLE RPR: RPR: NONREACTIVE

## 2022-07-22 LAB — OB RESULTS CONSOLE GC/CHLAMYDIA
Chlamydia: NEGATIVE
Neisseria Gonorrhea: NEGATIVE

## 2022-07-22 LAB — OB RESULTS CONSOLE GBS: GBS: NEGATIVE

## 2022-07-22 LAB — OB RESULTS CONSOLE HIV ANTIBODY (ROUTINE TESTING): HIV: NONREACTIVE

## 2022-08-05 ENCOUNTER — Other Ambulatory Visit: Payer: Self-pay

## 2022-08-05 ENCOUNTER — Encounter: Payer: Self-pay | Admitting: Obstetrics and Gynecology

## 2022-08-05 ENCOUNTER — Observation Stay
Admission: EM | Admit: 2022-08-05 | Discharge: 2022-08-05 | Disposition: A | Discharge status: Home / Self Care | Payer: BC Managed Care – PPO | Attending: Obstetrics and Gynecology | Admitting: Obstetrics and Gynecology

## 2022-08-05 DIAGNOSIS — Z3402 Encounter for supervision of normal first pregnancy, second trimester: Secondary | ICD-10-CM

## 2022-08-05 DIAGNOSIS — O99013 Anemia complicating pregnancy, third trimester: Secondary | ICD-10-CM | POA: Insufficient documentation

## 2022-08-05 DIAGNOSIS — O368130 Decreased fetal movements, third trimester, not applicable or unspecified: Secondary | ICD-10-CM | POA: Diagnosis not present

## 2022-08-05 DIAGNOSIS — O368190 Decreased fetal movements, unspecified trimester, not applicable or unspecified: Secondary | ICD-10-CM | POA: Diagnosis present

## 2022-08-05 DIAGNOSIS — D649 Anemia, unspecified: Secondary | ICD-10-CM | POA: Insufficient documentation

## 2022-08-05 DIAGNOSIS — Z3A38 38 weeks gestation of pregnancy: Secondary | ICD-10-CM | POA: Diagnosis not present

## 2022-08-05 LAB — CBC WITH DIFFERENTIAL/PLATELET
Abs Immature Granulocytes: 0.03 10*3/uL (ref 0.00–0.07)
Basophils Absolute: 0 10*3/uL (ref 0.0–0.1)
Basophils Relative: 0 %
Eosinophils Absolute: 0 10*3/uL (ref 0.0–0.5)
Eosinophils Relative: 0 %
HCT: 33.6 % — ABNORMAL LOW (ref 36.0–46.0)
Hemoglobin: 10.9 g/dL — ABNORMAL LOW (ref 12.0–15.0)
Immature Granulocytes: 0 %
Lymphocytes Relative: 24 %
Lymphs Abs: 1.9 10*3/uL (ref 0.7–4.0)
MCH: 26.2 pg (ref 26.0–34.0)
MCHC: 32.4 g/dL (ref 30.0–36.0)
MCV: 80.8 fL (ref 80.0–100.0)
Monocytes Absolute: 0.5 10*3/uL (ref 0.1–1.0)
Monocytes Relative: 7 %
Neutro Abs: 5.2 10*3/uL (ref 1.7–7.7)
Neutrophils Relative %: 69 %
Platelets: 226 10*3/uL (ref 150–400)
RBC: 4.16 MIL/uL (ref 3.87–5.11)
RDW: 16.4 % — ABNORMAL HIGH (ref 11.5–15.5)
WBC: 7.7 10*3/uL (ref 4.0–10.5)
nRBC: 0 % (ref 0.0–0.2)

## 2022-08-05 NOTE — OB Triage Note (Signed)
Pt discharged home per order.   Pt stable and ambulatory and an After Visit Summary was printed and given to the patient. Discharge education completed with patient/family including follow up instructions, appointments, and medication list. Pt received labor and bleeding precautions. Patient able to verbalize understanding, all questions fully answered upon discharge.   Pt discharged home via personal vehicle with support person.

## 2022-08-05 NOTE — Discharge Summary (Signed)
Patient ID: Nancy Mcfarland MRN: [id number] DOB/AGE: 12-06-1995 27 y.o.  Admit date: 08/05/2022 Discharge date: 08/05/2022  Admission Diagnoses: 27yo G2P0 at 77w2dsent from the office for decreased fetal movement, after a RNST for further evaluation. Dr. BLeafy Roalso requested a CBC.  AFI in the office was 8.1cm.   Discharge Diagnoses: RNST and stable maternal anemia  Factors complicating pregnancy: [facility]  - [facility] 40 +  - weekly NST at 34 weeks  - monthly growth in 3rd trimester  - Low lying placenta  - 10/23: 9 mm from internal os - 12/23: 6.7 cm from internal os  - HSV - Depression  - Lexapro & Buspar prior to pregnancy  - Anemia in pregnancy  - 05/30/22: Hgb 9.7, iron transfusions ordered - 06/27/22 Recheck Hgb 10.2 and ferritin 161   Prenatal Procedures: NST  Consults: None  Significant Diagnostic Studies:  Results for orders placed or performed during the hospital encounter of 08/05/22 (from the past 168 hour(s))  CBC with Differential/Platelet   Collection Time: 08/05/22  5:44 PM  Result Value Ref Range   WBC 7.7 4.0 - 10.5 K/uL   RBC 4.16 3.87 - 5.11 MIL/uL   Hemoglobin 10.9 (L) 12.0 - 15.0 g/dL   HCT 33.6 (L) 36.0 - 46.0 %   MCV 80.8 80.0 - 100.0 fL   MCH 26.2 26.0 - 34.0 pg   MCHC 32.4 30.0 - 36.0 g/dL   RDW 16.4 (H) 11.5 - 15.5 %   Platelets 226 150 - 400 K/uL   nRBC 0.0 0.0 - 0.2 %   Neutrophils Relative % 69 %   Neutro Abs 5.2 1.7 - 7.7 K/uL   Lymphocytes Relative 24 %   Lymphs Abs 1.9 0.7 - 4.0 K/uL   Monocytes Relative 7 %   Monocytes Absolute 0.5 0.1 - 1.0 K/uL   Eosinophils Relative 0 %   Eosinophils Absolute 0.0 0.0 - 0.5 K/uL   Basophils Relative 0 %   Basophils Absolute 0.0 0.0 - 0.1 K/uL   Immature Granulocytes 0 %   Abs Immature Granulocytes 0.03 0.00 - 0.07 K/uL    Treatments: none  Hospital Course:  This is a 27y.o. G2P0010 with IUP at 339w2deen for decreased fetal movement occurring last night.  She was  observed, fetal heart rate monitoring remained reassuring, and she had no signs/symptoms of labor or other maternal-fetal concerns.  She was deemed stable for discharge to home with outpatient follow up.  Discharge Physical Exam:  BP 123/85   Pulse (!) 105   Temp 98.2 F (36.8 C) (Oral)   Resp 18   Ht '5\' 3"'$  (1.6 m)   Wt 108 kg   LMP 11/10/2021 (Exact Date)   BMI 42.16 kg/m   General: NAD CV: RRR Pulm: nl effort ABD: s/nd/nt, gravid DVT Evaluation: LE non-ttp, no evidence of DVT on exam.  NST: FHR baseline: 140 bpm Variability: moderate Accelerations: yes Decelerations: none Category/reactivity: reactive  TOCO: quiet SVE: deferred      Discharge Condition: Stable  Disposition: Discharge disposition: 01-Home or Self Care        Allergies as of 08/05/2022   No Known Allergies      Medication List     TAKE these medications    busPIRone 10 MG tablet Commonly known as: BUSPAR Take by mouth.   escitalopram 10 MG tablet Commonly known as: LEXAPRO Take 10 mg by mouth daily.   ferrous sulfate 325 (65 FE)  MG tablet Take 1 tablet (325 mg total) by mouth daily with breakfast.   multivitamin-prenatal 27-0.8 MG Tabs tablet Take 1 tablet by mouth daily at 12 noon.   valACYclovir 500 MG tablet Commonly known as: VALTREX Take by mouth.        Follow-up [facility] Follow up.   Why: Keep all scheduled appointments Contact information: [address] [place] [phone]                Signed:  Regina Eck 08/05/2022 6:21 PM

## 2022-08-05 NOTE — Progress Notes (Signed)
Pt presents to L/D triage from office per MD for monitoring due to reported decreased fetal movement last night. Pt reports she has felt some movement today. She reports no bleeding or LOF and no contractions. Pt reports occasional mid-abdominal cramping, rated 3/10. No vaginal or urinary symptoms- pt PO hydrating. Monitors applied and assessing- initial FHT 150. VSS.

## 2022-08-14 ENCOUNTER — Other Ambulatory Visit: Payer: Self-pay | Admitting: Obstetrics and Gynecology

## 2022-08-14 DIAGNOSIS — O480 Post-term pregnancy: Secondary | ICD-10-CM

## 2022-08-14 DIAGNOSIS — Z3490 Encounter for supervision of normal pregnancy, unspecified, unspecified trimester: Secondary | ICD-10-CM

## 2022-08-14 NOTE — Progress Notes (Signed)
Postdates

## 2022-08-21 ENCOUNTER — Inpatient Hospital Stay
Admission: EM | Admit: 2022-08-21 | Discharge: 2022-08-25 | DRG: 768 | Disposition: A | Discharge status: Home / Self Care | Payer: BC Managed Care – PPO

## 2022-08-21 ENCOUNTER — Other Ambulatory Visit: Payer: Self-pay

## 2022-08-21 ENCOUNTER — Encounter: Payer: Self-pay | Admitting: Obstetrics and Gynecology

## 2022-08-21 DIAGNOSIS — Z3A40 40 weeks gestation of pregnancy: Secondary | ICD-10-CM | POA: Diagnosis not present

## 2022-08-21 DIAGNOSIS — O9902 Anemia complicating childbirth: Secondary | ICD-10-CM | POA: Diagnosis present

## 2022-08-21 DIAGNOSIS — O480 Post-term pregnancy: Secondary | ICD-10-CM | POA: Diagnosis present

## 2022-08-21 DIAGNOSIS — O99824 Streptococcus B carrier state complicating childbirth: Secondary | ICD-10-CM | POA: Diagnosis present

## 2022-08-21 DIAGNOSIS — Z87891 Personal history of nicotine dependence: Secondary | ICD-10-CM | POA: Diagnosis not present

## 2022-08-21 DIAGNOSIS — Z7982 Long term (current) use of aspirin: Secondary | ICD-10-CM | POA: Diagnosis not present

## 2022-08-21 DIAGNOSIS — O99013 Anemia complicating pregnancy, third trimester: Principal | ICD-10-CM

## 2022-08-21 DIAGNOSIS — O99214 Obesity complicating childbirth: Secondary | ICD-10-CM | POA: Diagnosis present

## 2022-08-21 DIAGNOSIS — O134 Gestational [pregnancy-induced] hypertension without significant proteinuria, complicating childbirth: Principal | ICD-10-CM | POA: Diagnosis present

## 2022-08-21 DIAGNOSIS — O139 Gestational [pregnancy-induced] hypertension without significant proteinuria, unspecified trimester: Secondary | ICD-10-CM | POA: Diagnosis present

## 2022-08-21 DIAGNOSIS — O9832 Other infections with a predominantly sexual mode of transmission complicating childbirth: Secondary | ICD-10-CM | POA: Diagnosis present

## 2022-08-21 DIAGNOSIS — A609 Anogenital herpesviral infection, unspecified: Secondary | ICD-10-CM | POA: Diagnosis present

## 2022-08-21 DIAGNOSIS — O99210 Obesity complicating pregnancy, unspecified trimester: Secondary | ICD-10-CM | POA: Diagnosis present

## 2022-08-21 DIAGNOSIS — A6000 Herpesviral infection of urogenital system, unspecified: Secondary | ICD-10-CM | POA: Diagnosis present

## 2022-08-21 DIAGNOSIS — D62 Acute posthemorrhagic anemia: Secondary | ICD-10-CM | POA: Diagnosis not present

## 2022-08-21 DIAGNOSIS — Z3402 Encounter for supervision of normal first pregnancy, second trimester: Secondary | ICD-10-CM

## 2022-08-21 DIAGNOSIS — R030 Elevated blood-pressure reading, without diagnosis of hypertension: Secondary | ICD-10-CM | POA: Diagnosis present

## 2022-08-21 DIAGNOSIS — O99019 Anemia complicating pregnancy, unspecified trimester: Secondary | ICD-10-CM | POA: Diagnosis present

## 2022-08-21 LAB — COMPREHENSIVE METABOLIC PANEL
ALT: 12 U/L (ref 0–44)
AST: 17 U/L (ref 15–41)
Albumin: 2.9 g/dL — ABNORMAL LOW (ref 3.5–5.0)
Alkaline Phosphatase: 120 U/L (ref 38–126)
Anion gap: 10 (ref 5–15)
BUN: 6 mg/dL (ref 6–20)
CO2: 18 mmol/L — ABNORMAL LOW (ref 22–32)
Calcium: 9.4 mg/dL (ref 8.9–10.3)
Chloride: 106 mmol/L (ref 98–111)
Creatinine, Ser: 0.49 mg/dL (ref 0.44–1.00)
GFR, Estimated: >60 mL/min (ref >60)
Glucose, Bld: 104 mg/dL — ABNORMAL HIGH (ref 70–99)
Potassium: 3.8 mmol/L (ref 3.5–5.1)
Sodium: 134 mmol/L — ABNORMAL LOW (ref 135–145)
Total Bilirubin: 0.5 mg/dL (ref 0.3–1.2)
Total Protein: 6.7 g/dL (ref 6.5–8.1)

## 2022-08-21 LAB — ABO/RH: ABO/RH(D): B POS

## 2022-08-21 LAB — CBC
HCT: 35.3 % — ABNORMAL LOW (ref 36.0–46.0)
Hemoglobin: 11.7 g/dL — ABNORMAL LOW (ref 12.0–15.0)
MCH: 26.6 pg (ref 26.0–34.0)
MCHC: 33.1 g/dL (ref 30.0–36.0)
MCV: 80.2 fL (ref 80.0–100.0)
Platelets: 219 10*3/uL (ref 150–400)
RBC: 4.4 MIL/uL (ref 3.87–5.11)
RDW: 16.2 % — ABNORMAL HIGH (ref 11.5–15.5)
WBC: 7.7 10*3/uL (ref 4.0–10.5)
nRBC: 0 % (ref 0.0–0.2)

## 2022-08-21 LAB — PROTEIN / CREATININE RATIO, URINE
Creatinine, Urine: 76 mg/dL
Protein Creatinine Ratio: 0.12 mg/mg{Cre} (ref 0.00–0.15)
Total Protein, Urine: 9 mg/dL

## 2022-08-21 LAB — URINE DRUG SCREEN, QUALITATIVE (ARMC ONLY)
Amphetamines, Ur Screen: NOT DETECTED
Barbiturates, Ur Screen: NOT DETECTED
Benzodiazepine, Ur Scrn: NOT DETECTED
Cannabinoid 50 Ng, Ur ~~LOC~~: NOT DETECTED
Cocaine Metabolite,Ur ~~LOC~~: NOT DETECTED
MDMA (Ecstasy)Ur Screen: NOT DETECTED
Methadone Scn, Ur: NOT DETECTED
Opiate, Ur Screen: NOT DETECTED
Phencyclidine (PCP) Ur S: NOT DETECTED
Tricyclic, Ur Screen: NOT DETECTED

## 2022-08-21 MED ORDER — TERBUTALINE SULFATE 1 MG/ML IJ SOLN: 0.2500 mg | Freq: Once | INTRAMUSCULAR | Status: DC | PRN

## 2022-08-21 MED ORDER — MISOPROSTOL 50MCG HALF TABLET: 50.0000 ug | ORAL_TABLET | Freq: Once | ORAL | Status: DC

## 2022-08-21 MED ORDER — AMMONIA AROMATIC IN INHA
RESPIRATORY_TRACT | Status: AC
  Filled 2022-08-21: qty 10

## 2022-08-21 MED ORDER — OXYCODONE-ACETAMINOPHEN 5-325 MG PO TABS: 2.0000 | ORAL_TABLET | ORAL | Status: DC | PRN

## 2022-08-21 MED ORDER — FENTANYL CITRATE (PF) 100 MCG/2ML IJ SOLN
50.0000 ug | INTRAMUSCULAR | Status: DC | PRN
  Administered 2022-08-21: 50 ug via INTRAVENOUS
  Administered 2022-08-22 (×2): 100 ug via INTRAVENOUS
  Filled 2022-08-21 (×3): qty 2

## 2022-08-21 MED ORDER — ACETAMINOPHEN 325 MG PO TABS: 650.0000 mg | ORAL_TABLET | ORAL | Status: DC | PRN

## 2022-08-21 MED ORDER — OXYCODONE-ACETAMINOPHEN 5-325 MG PO TABS: 1.0000 | ORAL_TABLET | ORAL | Status: DC | PRN

## 2022-08-21 MED ORDER — MISOPROSTOL 25 MCG QUARTER TABLET
ORAL_TABLET | ORAL | Status: AC
  Filled 2022-08-21: qty 2

## 2022-08-21 MED ORDER — MISOPROSTOL 200 MCG PO TABS
ORAL_TABLET | ORAL | Status: AC
  Filled 2022-08-21: qty 4

## 2022-08-21 MED ORDER — LACTATED RINGERS IV SOLN
500.0000 mL | INTRAVENOUS | Status: DC | PRN
  Administered 2022-08-22 (×2): 500 mL via INTRAVENOUS

## 2022-08-21 MED ORDER — ONDANSETRON HCL 4 MG/2ML IJ SOLN: 4.0000 mg | Freq: Four times a day (QID) | INTRAMUSCULAR | Status: DC | PRN

## 2022-08-21 MED ORDER — LACTATED RINGERS IV SOLN: INTRAVENOUS | Status: DC

## 2022-08-21 MED ORDER — OXYTOCIN-SODIUM CHLORIDE 30-0.9 UT/500ML-% IV SOLN
2.5000 [IU]/h | INTRAVENOUS | Status: DC
  Administered 2022-08-23 (×2): 2.5 [IU]/h via INTRAVENOUS
  Filled 2022-08-21 (×3): qty 500

## 2022-08-21 MED ORDER — MISOPROSTOL 25 MCG QUARTER TABLET
25.0000 ug | ORAL_TABLET | ORAL | Status: AC
  Administered 2022-08-21 – 2022-08-22 (×3): 25 ug via ORAL
  Filled 2022-08-21: qty 1

## 2022-08-21 MED ORDER — LIDOCAINE HCL (PF) 1 % IJ SOLN
INTRAMUSCULAR | Status: AC
  Filled 2022-08-21: qty 30

## 2022-08-21 MED ORDER — SOD CITRATE-CITRIC ACID 500-334 MG/5ML PO SOLN: 30.0000 mL | ORAL | Status: DC | PRN

## 2022-08-21 MED ORDER — OXYTOCIN 10 UNIT/ML IJ SOLN
INTRAMUSCULAR | Status: AC
  Filled 2022-08-21: qty 2

## 2022-08-21 MED ORDER — MISOPROSTOL 25 MCG QUARTER TABLET
25.0000 ug | ORAL_TABLET | ORAL | Status: DC | PRN
  Administered 2022-08-21: 25 ug via VAGINAL
  Filled 2022-08-21: qty 1

## 2022-08-21 MED ORDER — LIDOCAINE HCL (PF) 1 % IJ SOLN: 30.0000 mL | INTRAMUSCULAR | Status: DC | PRN

## 2022-08-21 MED ORDER — OXYTOCIN BOLUS FROM INFUSION
333.0000 mL | Freq: Once | INTRAVENOUS | Status: AC
  Administered 2022-08-23: 333 mL via INTRAVENOUS

## 2022-08-21 NOTE — OB Triage Note (Signed)
Pt Nancy Mcfarland 26 y.o. presents to labor and delivery triage reporting elevated blood pressures from the office. Pt is a G2P0010 at [redacted]w[redacted]d . Pt denies signs and symptoms consistent with rupture of membranes or active vaginal bleeding. Pt denies contractions and states positive fetal movement. External FM and TOCO applied to non-tender abdomen and assessing. Initial FHR 150. Vital signs obtained and within normal limits with the exception of slightly elevated BP. Provider notified of pt.

## 2022-08-21 NOTE — Progress Notes (Signed)
L&D Note    Subjective:  Contractions are feeling more regular and painful  Objective:   Vitals:   08/21/22 1627 08/21/22 1630 08/21/22 1645 08/21/22 1911  BP:  (!) 136/90 132/85 (!) 142/85  Pulse:  (!) 103 (!) 102 83  Resp: 18   19  Temp: 98.6 F (37 C)   97.8 F (36.6 C)  TempSrc: Oral   Oral  Weight: 104.3 kg     Height: 5\' 3"  (1.6 m)       Current Vital Signs 24h Vital Sign Ranges  T 97.8 F (36.6 C) Temp  Avg: 98.2 F (36.8 C)  Min: 97.8 F (36.6 C)  Max: 98.6 F (37 C)  BP (!) 142/85 BP  Min: 132/85  Max: 142/85  HR 83 Pulse  Avg: 96  Min: 83  Max: 103  RR 19 Resp  Avg: 18.5  Min: 18  Max: 19  SaO2     No data recorded      Gen: alert, cooperative, no distress FHR: Baseline: 140 bpm, Variability: moderate, Accels: Present, Decels: none Toco: irregular, every 2-7 minutes SVE: Dilation: Fingertip Effacement (%): 50 Cervical Position: Posterior Station: -3 Presentation: Vertex Exam by:: Ardelle Lesches, CNM  Medications SCHEDULED MEDICATIONS   ammonia       lidocaine (PF)       misoprostol       misoprostol       misoprostol  25 mcg Oral Q4H   oxytocin       oxytocin 40 units in LR 1000 mL  333 mL Intravenous Once    MEDICATION INFUSIONS   lactated ringers     lactated ringers     oxytocin      PRN MEDICATIONS  acetaminophen, ammonia, fentaNYL (SUBLIMAZE) injection, lactated ringers, lidocaine (PF), lidocaine (PF), misoprostol, misoprostol, misoprostol, ondansetron, oxytocin, sodium citrate-citric acid, terbutaline   Assessment & Plan:  27 y.o. G2P0010 at [redacted]w[redacted]d admitted for gestational hypertension -Labor: Cervical ripening with misoprostol - s/p 1 dose of oral and vaginal misoprostol.   -Fetal Well-being: Category I -GBS: negative -Membranes intact -Intervention: 2nd dose of oral misoprostol given for continued cervical ripening  -Analgesia:  Planning epidural    [name], CNM  08/21/2022 10:14 PM  [facility]

## 2022-08-21 NOTE — H&P (Signed)
OB History & Physical   History of Present Illness:   Chief Complaint: sent from office for elevated blood pressure   HPI:  Nancy Mcfarland is a 27 y.o. G2P0010 female at [redacted]w[redacted]d, Patient's last menstrual period was 11/10/2021 (exact date)., consistent with [place] at [redacted]w[redacted]d, with Estimated Date of Delivery: 08/17/22.  She presents to L&D for elevated blood pressures in office today.  Initial BP 154/105 and repeat 132/98.  Denies HA, changes in vision, or RUQ pain.  Reports intermittent light spotting that started this morning along with irregular contractions that started around 0600 today.  Denies LOF.  Endorses good fetal movement.  Initial BP in OB triage was 136/90.  Recommend staying and starting induction tonight versus scheduled IOL for tomorrow night.  Reviewed that risk of hypertension in pregnancy were higher than any gains that would happen from waiting for induction tomorrow night.  Nancy Mcfarland is nervous about the induction process but amenable with starting tonight.    Reports active fetal movement  Contractions:  Irregular, mild contractions  LOF/SROM: denies  Vaginal bleeding: light spotting   Factors complicating pregnancy:  Obesity in pregnancy  Low lying placenta now resolved  History of HSV - taking valtrex  History of depression and anxiety - no current medications   Patient Active Problem List   Diagnosis Date Noted   Gestational hypertension 08/21/2022   Obesity affecting pregnancy BMI=43.5 01/25/2022   HSV (herpes simplex virus) +11/13/2015 01/25/2022   Depression affecting pregnancy dx'd 06/2021 01/25/2022   Anemia affecting pregnancy 01/25/2022   Supervision of high-risk pregnancy, third trimester 01/25/2022    Prenatal Transfer Tool  Maternal Diabetes: No Genetic Screening: Normal Maternal Ultrasounds/Referrals: Normal Fetal Ultrasounds or other Referrals:  Referred to Materal Fetal Medicine  Maternal Substance Abuse:  No - History of marijuana use in 11/2021 Significant  Maternal Medications:  Meds include: Other: Valtrex  Significant Maternal Lab Results: Group B Strep positive  Maternal Medical History:   Past Medical History:  Diagnosis Date   Anxiety    BV (bacterial vaginosis)    Herpes genitalia    History of chlamydia    Major depression     Past Surgical History:  Procedure Laterality Date   TONSILLECTOMY  05/2014   WISDOM TOOTH EXTRACTION  01/2015    No Known Allergies  Prior to Admission medications   Medication Sig Start Date End Date Taking? Authorizing Provider  aspirin EC 81 MG tablet Take 81 mg by mouth daily.    [provider]  ferrous sulfate 325 (65 FE) MG tablet Take 1 tablet (325 mg total) by mouth daily with breakfast. 01/25/22   Sciora, Real Cons, CNM  Prenatal Vit-Fe Fumarate-FA (MULTIVITAMIN-PRENATAL) 27-0.8 MG TABS tablet Take 1 tablet by mouth daily at 12 noon.    [provider]  valACYclovir (VALTREX) 500 MG tablet Take by mouth. Patient not taking: Reported on 01/25/2022 12/20/20   [provider]     Prenatal care site:  [facility]  OB History  Gravida Para Term Preterm AB Living  2 0 0 0 1 0  SAB IAB Ectopic Multiple Live Births  0 0 0 0 0    # Outcome Date GA Lbr Len/2nd Weight Sex Delivery Anes PTL Lv  2 Current           1 AB 12/2017 [redacted]w[redacted]d            Social History: She  reports that she has quit smoking. Her smoking use included cigars  and e-cigarettes. She has never been exposed to tobacco smoke. She has never used smokeless tobacco. She reports current alcohol use of about 13.0 standard drinks of alcohol per week. She reports current drug use. Drug: Marijuana.  Family History: family history includes Colon cancer in her paternal grandfather; Diabetes in her paternal aunt and paternal uncle; Healthy in her brother and father; Heart disease in her paternal grandmother; Hypertension in her paternal aunt, paternal grandmother, and paternal uncle; Lung disease in her  paternal grandmother; Other in her mother.   Review of Systems: A full review of systems was performed and negative except as noted in the HPI.     Physical Exam:  Vital Signs: Temp 98.6 F (37 C) (Oral)   Resp 18   Ht 5\' 3"  (1.6 m)   Wt 104.3 kg   LMP 11/10/2021 (Exact Date)   BMI 40.74 kg/m   General: no acute distress.  HEENT: normocephalic, atraumatic Heart: regular rate & rhythm Lungs: normal respiratory effort Abdomen: soft, gravid, non-tender;  EFW: 7 1/2 lbs  Pelvic:   External: Normal external female genitalia  Cervix: Dilation: Fingertip / Effacement (%): 50 / Station: -3    Extremities: non-tender, symmetric, No edema bilaterally.  DTRs: 2+/2+  Neurologic: Alert & oriented x 3.    No results found for this or any previous visit (from the past 24 hour(s)).  Pertinent Results:  Prenatal Labs: Blood type/Rh B Positive    Antibody screen Negative    Rubella Immune    Varicella Immune  RPR Nonreactive (02/19 0000)   HBsAg Negative (08/25 1113)  Hep C NR   HIV Non-reactive (02/19 0000)   GC neg  Chlamydia neg  Genetic screening cfDNA negative/AFP neg  1 hour GTT 116  3 hour GTT N/A  GBS Negative/-- (02/19 0000)    FHT:  FHR: 140 bpm, variability: moderate,  accelerations:  Present,  decelerations:  Absent Category/reactivity:  Category I UC:   Irregular, mild contractions    Cephalic by bedside ultrasound   No results found.  Assessment:  Nancy Mcfarland is a 27 y.o. G2P0010 female at [redacted]w[redacted]d with gestational hypertension at term.   Plan:  1. Admit to Labor & Delivery - consents reviewed and obtained - Dr. Leafy Ro notified of admission and plan of care   2. Fetal Well being  - Fetal Tracing: cat 1 - Group B Streptococcus ppx not indicated: GBS negative - Presentation: cephalic confirmed by ultrasound   3. Routine OB: - Prenatal labs reviewed, as above - Rh positive - CBC, T&S, RPR on admit - Regular diet, saline lock  4. Gestational  hypertension - BP's mild range  - Preeclampsia labs ordered   5. Induction of labor  - Contractions monitored with external toco - Pelvis adequate for trial of labor  - Plan for induction with misoprostol  - Induction with oxytocin, AROM, and cervical balloon as appropriate  - Plan for  continuous fetal monitoring - Maternal pain control as desired - Anticipate vaginal delivery  6. Post Partum Planning: - Infant feeding: formula feeding but may consider trying to latch baby  - Contraception:  Nexplanon  - Flu vaccine:  declined  - Tdap vaccine: Given prenatally - RSV vaccine: Given prenatally  [name], [place] 08/21/22 5:16 PM  [name], [place] Certified Nurse Midwife [facility]

## 2022-08-22 ENCOUNTER — Inpatient Hospital Stay: Payer: BC Managed Care – PPO | Admitting: Anesthesiology

## 2022-08-22 LAB — RPR: RPR Ser Ql: NONREACTIVE

## 2022-08-22 MED ORDER — KETOROLAC TROMETHAMINE 30 MG/ML IJ SOLN: 30.0000 mg | Freq: Four times a day (QID) | INTRAMUSCULAR | Status: AC | PRN

## 2022-08-22 MED ORDER — LIDOCAINE HCL (PF) 1 % IJ SOLN
INTRAMUSCULAR | Status: DC | PRN
  Administered 2022-08-22: 2 mL

## 2022-08-22 MED ORDER — LACTATED RINGERS IV SOLN
500.0000 mL | Freq: Once | INTRAVENOUS | Status: AC
  Administered 2022-08-22: 500 mL via INTRAVENOUS

## 2022-08-22 MED ORDER — OXYCODONE HCL 5 MG PO TABS: 5.0000 mg | ORAL_TABLET | Freq: Four times a day (QID) | ORAL | Status: DC | PRN

## 2022-08-22 MED ORDER — OXYTOCIN-SODIUM CHLORIDE 30-0.9 UT/500ML-% IV SOLN
1.0000 m[IU]/min | INTRAVENOUS | Status: DC
  Administered 2022-08-22: 2 m[IU]/min via INTRAVENOUS

## 2022-08-22 MED ORDER — ONDANSETRON HCL 4 MG/2ML IJ SOLN: 4.0000 mg | Freq: Three times a day (TID) | INTRAMUSCULAR | Status: DC | PRN

## 2022-08-22 MED ORDER — TERBUTALINE SULFATE 1 MG/ML IJ SOLN: 0.2500 mg | Freq: Once | INTRAMUSCULAR | Status: DC | PRN

## 2022-08-22 MED ORDER — PHENYLEPHRINE 80 MCG/ML (10ML) SYRINGE FOR IV PUSH (FOR BLOOD PRESSURE SUPPORT): 80.0000 ug | PREFILLED_SYRINGE | INTRAVENOUS | Status: DC | PRN

## 2022-08-22 MED ORDER — MISOPROSTOL 50MCG HALF TABLET
50.0000 ug | ORAL_TABLET | ORAL | Status: DC
  Administered 2022-08-22: 50 ug via VAGINAL
  Filled 2022-08-22: qty 1

## 2022-08-22 MED ORDER — EPHEDRINE 5 MG/ML INJ
10.0000 mg | INTRAVENOUS | Status: DC | PRN
  Administered 2022-08-22: 10 mg via INTRAVENOUS

## 2022-08-22 MED ORDER — FENTANYL-BUPIVACAINE-NACL 0.5-0.125-0.9 MG/250ML-% EP SOLN
12.0000 mL/h | EPIDURAL | Status: DC | PRN
  Administered 2022-08-22: 12 mL/h via EPIDURAL
  Filled 2022-08-22: qty 250

## 2022-08-22 MED ORDER — DIPHENHYDRAMINE HCL 50 MG/ML IJ SOLN
12.5000 mg | INTRAMUSCULAR | Status: DC | PRN
  Administered 2022-08-22: 12.5 mg via INTRAVENOUS

## 2022-08-22 MED ORDER — MISOPROSTOL 25 MCG QUARTER TABLET
25.0000 ug | ORAL_TABLET | ORAL | Status: DC
  Administered 2022-08-22: 25 ug via ORAL
  Filled 2022-08-22: qty 1

## 2022-08-22 MED ORDER — NALOXONE HCL 4 MG/10ML IJ SOLN: 1.0000 ug/kg/h | INTRAVENOUS | Status: DC | PRN

## 2022-08-22 MED ORDER — EPHEDRINE 5 MG/ML INJ
10.0000 mg | INTRAVENOUS | Status: DC | PRN
  Filled 2022-08-22: qty 5

## 2022-08-22 MED ORDER — NALOXONE HCL 0.4 MG/ML IJ SOLN: 0.4000 mg | INTRAMUSCULAR | Status: DC | PRN

## 2022-08-22 MED ORDER — SODIUM CHLORIDE 0.9% FLUSH
3.0000 mL | INTRAVENOUS | Status: DC | PRN
  Administered 2022-08-23: 10 mL via INTRAVENOUS

## 2022-08-22 MED ORDER — MEPERIDINE HCL 25 MG/ML IJ SOLN: 6.2500 mg | INTRAMUSCULAR | Status: DC | PRN

## 2022-08-22 MED ORDER — FENTANYL-BUPIVACAINE-NACL 0.5-0.125-0.9 MG/250ML-% EP SOLN
EPIDURAL | Status: AC
  Filled 2022-08-22: qty 250

## 2022-08-22 MED ORDER — LIDOCAINE-EPINEPHRINE (PF) 1.5 %-1:200000 IJ SOLN
INTRAMUSCULAR | Status: DC | PRN
  Administered 2022-08-22: 3 mL via PERINEURAL

## 2022-08-22 MED ORDER — BUPIVACAINE HCL (PF) 0.25 % IJ SOLN
INTRAMUSCULAR | Status: DC | PRN
  Administered 2022-08-22: 2 mL via EPIDURAL
  Administered 2022-08-22: 3 mL via EPIDURAL

## 2022-08-22 MED ORDER — DIPHENHYDRAMINE HCL 50 MG/ML IJ SOLN
12.5000 mg | INTRAMUSCULAR | Status: DC | PRN
  Filled 2022-08-22: qty 1

## 2022-08-22 MED ORDER — DIPHENHYDRAMINE HCL 25 MG PO CAPS: 25.0000 mg | ORAL_CAPSULE | ORAL | Status: DC | PRN

## 2022-08-22 MED ORDER — SCOPOLAMINE 1 MG/3DAYS TD PT72: 1.0000 | MEDICATED_PATCH | Freq: Once | TRANSDERMAL | Status: DC

## 2022-08-22 NOTE — Progress Notes (Signed)
Labor Progress Note  Nancy Mcfarland is a 27 y.o. G2P0010 at [redacted]w[redacted]d by LMP admitted for induction of labor due to Gestational hypertension.  Subjective: Doing well, tolerating position changes well  Objective: BP 122/81 (BP Location: Left Arm)   Pulse 85   Temp 98.4 F (36.9 C) (Oral)   Resp 16   Ht 5\' 3"  (1.6 m)   Wt 104.3 kg   LMP 11/10/2021 (Exact Date)   SpO2 99%   BMI 40.74 kg/m   Vitals:   08/22/22 0830 08/22/22 0845 08/22/22 0900 08/22/22 0905  BP: (!) 100/54 (!) 100/55 (!) 89/55 (!) 90/48   08/22/22 0910 08/22/22 0915 08/22/22 0930 08/22/22 1030  BP: 112/65 (!) 106/57 (!) 106/58 103/62   08/22/22 1130 08/22/22 1240 08/22/22 1335 08/22/22 1435  BP: 110/65 121/78 123/84 122/81      Fetal Assessment: FHT:  FHR: 125 bpm, variability: moderate,  accelerations:  Present,  decelerations:  Absent Category/reactivity:  Category I UC:   regular, every 2-4 minutes SVE:    Dilation: 4 cm  Effacement: 80%  Station:  -3  Consistency: soft  Position: posterior  Membrane status: SROM at 1837 Amniotic color: Clear  Labs: Lab Results  Component Value Date   WBC 7.7 08/21/2022   HGB 11.7 (L) 08/21/2022   HCT 35.3 (L) 08/21/2022   MCV 80.2 08/21/2022   PLT 219 08/21/2022    Assessment / Plan: Induction of labor due to gestational hypertension 1655 FT/50/-3 1811 Cytotec 6mcg PO and PV 2206 FT/50/-3 2212 Cytotec 7mcg PO 0320 FT/50/-3 0333 Cytotec 102mcg PO 0946 1/50/-3 1025 Cytotec 17mcg PV and 43mcg PO 1421 3/80/-3 1621 Started Pitocin 1812 4/80/-3-2 1837 SROM Currently at 93mU  Labor: Progressing normally Preeclampsia:  no signs or symptoms of toxicity, labs stable, and BP charted above Fetal Wellbeing:  Category I Pain Control:  Epidural I/D:   Afebrile, GBS neg, SROM Anticipated MOD:  NSVD  Clydene Laming, CNM 08/22/2022, 6:40 PM

## 2022-08-22 NOTE — Anesthesia Procedure Notes (Signed)
Epidural Patient location during procedure: OB Start time: 08/22/2022 7:44 AM End time: 08/22/2022 8:02 AM  Staffing Anesthesiologist: Ilene Qua, MD Resident/CRNA: Johnna Acosta, CRNA Performed: resident/CRNA   Preanesthetic Checklist Completed: patient identified, IV checked, site marked, risks and benefits discussed, surgical consent, monitors and equipment checked, pre-op evaluation and timeout performed  Epidural Patient position: sitting Prep: ChloraPrep Patient monitoring: heart rate, continuous pulse ox and blood pressure Approach: midline Location: L3-L4 Injection technique: LOR saline  Needle:  Needle type: Tuohy  Needle gauge: 17 G Needle length: 9 cm and 9 Needle insertion depth: 9.5 cm Catheter type: closed end flexible Catheter size: 19 Gauge Catheter at skin depth: 14 cm Test dose: negative and 1.5% lidocaine with Epi 1:200 K  Assessment Sensory level: T10 Events: blood not aspirated, no cerebrospinal fluid, injection not painful, no injection resistance, no paresthesia and negative IV test  Additional Notes 1 attempt Pt. Evaluated and documentation done after procedure finished. Patient identified. Risks/Benefits/Options discussed with patient including but not limited to bleeding, infection, nerve damage, paralysis, failed block, incomplete pain control, headache, blood pressure changes, nausea, vomiting, reactions to medication both or allergic, itching and postpartum back pain. Confirmed with bedside nurse the patient's most recent platelet count. Confirmed with patient that they are not currently taking any anticoagulation, have any bleeding history or any family history of bleeding disorders. Patient expressed understanding and wished to proceed. All questions were answered. Sterile technique was used throughout the entire procedure. Please see nursing notes for vital signs. Test dose was given through epidural catheter and negative prior to  continuing to dose epidural or start infusion. Warning signs of high block given to the patient including shortness of breath, tingling/numbness in hands, complete motor block, or any concerning symptoms with instructions to call for help. Patient was given instructions on fall risk and not to get out of bed. All questions and concerns addressed with instructions to call with any issues or inadequate analgesia.    Patient tolerated the insertion well without immediate complications.Reason for block:procedure for pain

## 2022-08-22 NOTE — Progress Notes (Signed)
Labor Progress Note  Nancy Mcfarland is a 27 y.o. G2P0010 at [redacted]w[redacted]d by LMP admitted for induction of labor due to Gestational hypertension.  Subjective: Assumed care from AM.  Pt is starting to feel more comfortable after epidural placement.   Objective: BP (!) 144/81 (BP Location: Left Arm)   Pulse 98   Temp 99 F (37.2 C) (Oral)   Resp 16   Ht 5\' 3"  (1.6 m)   Wt 104.3 kg   LMP 11/10/2021 (Exact Date)   BMI 40.74 kg/m   Vitals:   08/21/22 1630 08/21/22 1645 08/21/22 1911 08/22/22 0110  BP: (!) 136/90 132/85 (!) 142/85 (!) 127/98   08/22/22 0458 08/22/22 0708  BP: (!) 147/85 (!) 144/81      Fetal Assessment: FHT:  FHR: 130 bpm, variability: moderate,  accelerations:  Present,  decelerations:  Absent Category/reactivity:  Category I UC:   regular, every 1-3 minutes SVE:    Dilation: 0.5cm  Effacement: 50%  Station:  -3  Consistency: soft  Position: ---  Membrane status: Intact Amniotic color: N/a  Labs: Lab Results  Component Value Date   WBC 7.7 08/21/2022   HGB 11.7 (L) 08/21/2022   HCT 35.3 (L) 08/21/2022   MCV 80.2 08/21/2022   PLT 219 08/21/2022    Assessment / Plan: Induction of labor due to gestational hypertension 1655 FT/50/-3 1811 Cytotec 107mcg PO and PV 2206 FT/50/-3 2212 Cytotec 57mcg PO 0320 FT/50/-3 [id number] Cytotec 31mcg PO   Labor: Progressing normally Preeclampsia:  no signs or symptoms of toxicity, labs stable, and BP charted above Fetal Wellbeing:  Category I Pain Control:  Epidural I/D:   Afebrile, Intact, GBS neg Anticipated MOD:  NSVD  Clydene Laming, CNM 08/22/2022, 8:13 AM

## 2022-08-22 NOTE — Progress Notes (Addendum)
Notified anesthesia of patient's low BP. Anesthesia supports decision to administer 10 mg of ephedrine and another 500 mL bolus of LR. RN administered ephedrine. BP has increased to WNL.

## 2022-08-22 NOTE — Anesthesia Preprocedure Evaluation (Signed)
Anesthesia Evaluation  Patient identified by MRN, date of birth, ID band Patient awake    Reviewed: Allergy & Precautions, H&P , NPO status , Patient's Chart, lab work & pertinent test results  Airway Mallampati: III  TM Distance: <3 FB Neck ROM: full  Mouth opening: Limited Mouth Opening  Dental no notable dental hx. (+) Teeth Intact   Pulmonary neg pulmonary ROS, former smoker   Pulmonary exam normal        Cardiovascular Exercise Tolerance: Good hypertension, Normal cardiovascular exam     Neuro/Psych   Anxiety Depression    negative neurological ROS     GI/Hepatic negative GI ROS, Neg liver ROS,,,  Endo/Other  negative endocrine ROS    Renal/GU negative Renal ROS  negative genitourinary   Musculoskeletal   Abdominal   Peds  Hematology  (+) Blood dyscrasia, anemia   Anesthesia Other Findings   Reproductive/Obstetrics (+) Pregnancy                             Anesthesia Physical Anesthesia Plan  ASA: 3  Anesthesia Plan: Epidural   Post-op Pain Management:    Induction:   PONV Risk Score and Plan:   Airway Management Planned:   Additional Equipment:   Intra-op Plan:   Post-operative Plan:   Informed Consent: I have reviewed the patients History and Physical, chart, labs and discussed the procedure including the risks, benefits and alternatives for the proposed anesthesia with the patient or authorized representative who has indicated his/her understanding and acceptance.     Dental Advisory Given  Plan Discussed with: Anesthesiologist  Anesthesia Plan Comments:        Anesthesia Quick Evaluation

## 2022-08-22 NOTE — Progress Notes (Signed)
Labor Progress Note  Nancy Mcfarland is a 27 y.o. G2P0010 at [redacted]w[redacted]d by LMP admitted for induction of labor due to Gestational hypertension.  Subjective: Able to rest, doing well  Objective: BP 122/81 (BP Location: Left Arm)   Pulse 85   Temp 98.4 F (36.9 C) (Oral)   Resp 16   Ht 5\' 3"  (1.6 m)   Wt 104.3 kg   LMP 11/10/2021 (Exact Date)   SpO2 99%   BMI 40.74 kg/m   Vitals:   08/22/22 0830 08/22/22 0845 08/22/22 0900 08/22/22 0905  BP: (!) 100/54 (!) 100/55 (!) 89/55 (!) 90/48   08/22/22 0910 08/22/22 0915 08/22/22 0930 08/22/22 1030  BP: 112/65 (!) 106/57 (!) 106/58 103/62   08/22/22 1130 08/22/22 1240 08/22/22 1335 08/22/22 1435  BP: 110/65 121/78 123/84 122/81      Fetal Assessment: FHT:  FHR: 125 bpm, variability: moderate,  accelerations:  Present,  decelerations:  Absent Category/reactivity:  Category I UC:   regular, every 2-4 minutes SVE:    Dilation: 1cm  Effacement: 80%  Station:  -3  Consistency: soft  Position: posterior  Membrane status: Intact Amniotic color: N/a  Labs: Lab Results  Component Value Date   WBC 7.7 08/21/2022   HGB 11.7 (L) 08/21/2022   HCT 35.3 (L) 08/21/2022   MCV 80.2 08/21/2022   PLT 219 08/21/2022    Assessment / Plan: Induction of labor due to gestational hypertension 1655 FT/50/-3 1811 Cytotec 65mcg PO and PV 2206 FT/50/-3 2212 Cytotec 16mcg PO 0320 FT/50/-3 [id number] Cytotec 2mcg PO 1025 Cytotec 85mcg PV and 11mcg PO Starting Pitocin  Labor: Progressing normally Preeclampsia:  no signs or symptoms of toxicity, labs stable, and BP charted above Fetal Wellbeing:  Category I Pain Control:  Epidural I/D:   Afebrile, Intact, GBS neg Anticipated MOD:  NSVD  Clydene Laming, CNM 08/22/2022, 3:29 PM

## 2022-08-22 NOTE — Progress Notes (Signed)
Labor Progress Note  Nancy Mcfarland is a 27 y.o. G2P0010 at [redacted]w[redacted]d by LMP admitted for induction of labor due to Gestational hypertension.  Subjective: Doing well, trying to get some rest  Objective: BP 124/70 (BP Location: Left Arm)   Pulse 95   Temp 98.3 F (36.8 C) (Oral)   Resp 16   Ht 5\' 3"  (1.6 m)   Wt 104.3 kg   LMP 11/10/2021 (Exact Date)   SpO2 100%   BMI 40.74 kg/m   Vitals:   08/22/22 0845 08/22/22 0900 08/22/22 0905 08/22/22 0910  BP: (!) 100/55 (!) 89/55 (!) 90/48 112/65   08/22/22 0915 08/22/22 0930 08/22/22 1030 08/22/22 1130  BP: (!) 106/57 (!) 106/58 103/62 110/65   08/22/22 1240 08/22/22 1335 08/22/22 1435 08/22/22 1937  BP: 121/78 123/84 122/81 124/70      Fetal Assessment: FHT:  FHR: 130 bpm, variability: moderate,  accelerations:  Present,  decelerations:  Absent Category/reactivity:  Category I UC:   regular, every 2-4 minutes SVE:    Dilation: 4 cm  Effacement: 80%  Station:  -1  Consistency: soft  Position: anterior  Membrane status: SROM at 1837 Amniotic color: Clear  Labs: Lab Results  Component Value Date   WBC 7.7 08/21/2022   HGB 11.7 (L) 08/21/2022   HCT 35.3 (L) 08/21/2022   MCV 80.2 08/21/2022   PLT 219 08/21/2022    Assessment / Plan: Induction of labor due to gestational hypertension 1655 FT/50/-3 1811 Cytotec 72mcg PO and PV 2206 FT/50/-3 2212 Cytotec 40mcg PO 0320 FT/50/-3 0333 Cytotec 10mcg PO 0946 1/50/-3 1025 Cytotec 62mcg PV and 53mcg PO 1421 3/80/-3 1621 Started Pitocin 1812 4/80/-3-2 1837 SROM Currently at 16mU  Labor: Progressing normally Preeclampsia:  no signs or symptoms of toxicity, labs stable, and BP charted above Fetal Wellbeing:  Category I Pain Control:  Epidural I/D:   Afebrile, GBS neg, SROM x3hrs Anticipated MOD:  NSVD  Clydene Laming, CNM 08/22/2022, 9:34 PM

## 2022-08-23 ENCOUNTER — Inpatient Hospital Stay: Payer: BC Managed Care – PPO

## 2022-08-23 ENCOUNTER — Encounter: Payer: Self-pay | Admitting: Obstetrics and Gynecology

## 2022-08-23 LAB — CBC
HCT: 25.1 % — ABNORMAL LOW (ref 36.0–46.0)
Hemoglobin: 8.2 g/dL — ABNORMAL LOW (ref 12.0–15.0)
MCH: 27.1 pg (ref 26.0–34.0)
MCHC: 32.7 g/dL (ref 30.0–36.0)
MCV: 82.8 fL (ref 80.0–100.0)
Platelets: 222 10*3/uL (ref 150–400)
RBC: 3.03 MIL/uL — ABNORMAL LOW (ref 3.87–5.11)
RDW: 16.4 % — ABNORMAL HIGH (ref 11.5–15.5)
WBC: 17.8 10*3/uL — ABNORMAL HIGH (ref 4.0–10.5)
nRBC: 0 % (ref 0.0–0.2)

## 2022-08-23 MED ORDER — MISOPROSTOL 200 MCG PO TABS
ORAL_TABLET | ORAL | Status: AC
  Filled 2022-08-23: qty 4

## 2022-08-23 MED ORDER — DIBUCAINE (PERIANAL) 1 % EX OINT: 1.0000 | TOPICAL_OINTMENT | CUTANEOUS | Status: DC | PRN

## 2022-08-23 MED ORDER — ONDANSETRON HCL 4 MG/2ML IJ SOLN: 4.0000 mg | INTRAMUSCULAR | Status: DC | PRN

## 2022-08-23 MED ORDER — SODIUM CHLORIDE 0.9% IV SOLUTION: Freq: Once | INTRAVENOUS | Status: AC

## 2022-08-23 MED ORDER — TRANEXAMIC ACID-NACL 1000-0.7 MG/100ML-% IV SOLN: 1000.0000 mg | Freq: Once | INTRAVENOUS | Status: AC

## 2022-08-23 MED ORDER — MISOPROSTOL 200 MCG PO TABS
800.0000 ug | ORAL_TABLET | Freq: Once | ORAL | Status: AC
  Administered 2022-08-23: 800 ug via RECTAL

## 2022-08-23 MED ORDER — LACTATED RINGERS IV BOLUS
1000.0000 mL | Freq: Once | INTRAVENOUS | Status: AC
  Administered 2022-08-23: 1000 mL via INTRAVENOUS

## 2022-08-23 MED ORDER — FENTANYL CITRATE (PF) 100 MCG/2ML IJ SOLN
50.0000 ug | Freq: Once | INTRAMUSCULAR | Status: AC
  Administered 2022-08-23: 50 ug via INTRAVENOUS

## 2022-08-23 MED ORDER — TRANEXAMIC ACID-NACL 1000-0.7 MG/100ML-% IV SOLN
1000.0000 mg | Freq: Once | INTRAVENOUS | Status: AC
  Administered 2022-08-23: 1000 mg via INTRAVENOUS

## 2022-08-23 MED ORDER — PRENATAL MULTIVITAMIN CH
1.0000 | ORAL_TABLET | Freq: Every day | ORAL | Status: DC
  Administered 2022-08-23 – 2022-08-24 (×2): 1 via ORAL
  Filled 2022-08-23 (×2): qty 1

## 2022-08-23 MED ORDER — DIPHENHYDRAMINE HCL 25 MG PO CAPS: 25.0000 mg | ORAL_CAPSULE | Freq: Four times a day (QID) | ORAL | Status: DC | PRN

## 2022-08-23 MED ORDER — FENTANYL CITRATE (PF) 100 MCG/2ML IJ SOLN
INTRAMUSCULAR | Status: AC
  Filled 2022-08-23: qty 4

## 2022-08-23 MED ORDER — COCONUT OIL OIL
1.0000 | TOPICAL_OIL | Status: DC | PRN
  Filled 2022-08-23: qty 7.5

## 2022-08-23 MED ORDER — OXYCODONE HCL 5 MG PO TABS: 5.0000 mg | ORAL_TABLET | ORAL | Status: DC | PRN

## 2022-08-23 MED ORDER — CALCIUM CARBONATE ANTACID 500 MG PO CHEW
400.0000 mg | CHEWABLE_TABLET | Freq: Once | ORAL | Status: AC
  Administered 2022-08-23: 400 mg via ORAL
  Filled 2022-08-23: qty 2

## 2022-08-23 MED ORDER — IBUPROFEN 600 MG PO TABS
600.0000 mg | ORAL_TABLET | Freq: Four times a day (QID) | ORAL | Status: DC
  Administered 2022-08-23 – 2022-08-24 (×6): 600 mg via ORAL
  Filled 2022-08-23 (×7): qty 1

## 2022-08-23 MED ORDER — ACETAMINOPHEN 325 MG PO TABS
650.0000 mg | ORAL_TABLET | ORAL | Status: DC | PRN
  Administered 2022-08-23 – 2022-08-24 (×4): 650 mg via ORAL
  Filled 2022-08-23 (×4): qty 2

## 2022-08-23 MED ORDER — SODIUM CHLORIDE 0.9 % IV SOLN
2.0000 g | Freq: Once | INTRAVENOUS | Status: AC
  Administered 2022-08-23: 2 g via INTRAVENOUS
  Filled 2022-08-23: qty 2000

## 2022-08-23 MED ORDER — TETANUS-DIPHTH-ACELL PERTUSSIS 5-2.5-18.5 LF-MCG/0.5 IM SUSY
0.5000 mL | PREFILLED_SYRINGE | Freq: Once | INTRAMUSCULAR | Status: DC
  Filled 2022-08-23: qty 0.5

## 2022-08-23 MED ORDER — ZOLPIDEM TARTRATE 5 MG PO TABS: 5.0000 mg | ORAL_TABLET | Freq: Every evening | ORAL | Status: DC | PRN

## 2022-08-23 MED ORDER — SODIUM CHLORIDE 0.9 % IV SOLN
300.0000 mg | Freq: Once | INTRAVENOUS | Status: AC
  Administered 2022-08-23: 300 mg via INTRAVENOUS
  Filled 2022-08-23: qty 15

## 2022-08-23 MED ORDER — TRANEXAMIC ACID-NACL 1000-0.7 MG/100ML-% IV SOLN
INTRAVENOUS | Status: AC
  Filled 2022-08-23: qty 100

## 2022-08-23 MED ORDER — ONDANSETRON HCL 4 MG PO TABS: 4.0000 mg | ORAL_TABLET | ORAL | Status: DC | PRN

## 2022-08-23 MED ORDER — FENTANYL CITRATE (PF) 100 MCG/2ML IJ SOLN
INTRAMUSCULAR | Status: AC
  Filled 2022-08-23: qty 2

## 2022-08-23 MED ORDER — FENTANYL CITRATE (PF) 100 MCG/2ML IJ SOLN: 150.0000 ug | Freq: Once | INTRAMUSCULAR | Status: DC

## 2022-08-23 MED ORDER — WITCH HAZEL-GLYCERIN EX PADS
1.0000 | MEDICATED_PAD | CUTANEOUS | Status: DC | PRN
  Administered 2022-08-23: 1 via TOPICAL
  Filled 2022-08-23: qty 100

## 2022-08-23 MED ORDER — SENNOSIDES-DOCUSATE SODIUM 8.6-50 MG PO TABS
2.0000 | ORAL_TABLET | Freq: Every day | ORAL | Status: DC
  Filled 2022-08-23: qty 2

## 2022-08-23 MED ORDER — SIMETHICONE 80 MG PO CHEW: 80.0000 mg | CHEWABLE_TABLET | ORAL | Status: DC | PRN

## 2022-08-23 MED ORDER — BENZOCAINE-MENTHOL 20-0.5 % EX AERO
1.0000 | INHALATION_SPRAY | CUTANEOUS | Status: DC | PRN
  Administered 2022-08-23: 1 via TOPICAL
  Filled 2022-08-23: qty 56

## 2022-08-23 NOTE — Progress Notes (Signed)
Labor Progress Note  Nancy Mcfarland is a 27 y.o. G2P0010 at [redacted]w[redacted]d by LMP admitted for induction of labor due to [place].  Subjective: she reports more rectal pressure  Objective: BP (!) 119/94   Pulse (!) 104   Temp 98.8 F (37.1 C) (Oral)   Resp 18   Ht 5\' 3"  (1.6 m)   Wt 104.3 kg   LMP 11/10/2021 (Exact Date)   SpO2 100%   BMI 40.74 kg/m  Notable VS details: reviewed  Fetal Assessment: FHT:  FHR: 135 bpm, variability: moderate,  accelerations:  Present,  decelerations:  Present intermittent variable decelerations Category/reactivity:  Category II UC:   regular, every 3-4 minutes SVE:    Dilation: 9.5cm  Effacement: 90%  Station:  +1  Consistency: soft  Position: anterior  Membrane status:SROM @ 1837 Amniotic color: clear  Labs: Lab Results  Component Value Date   WBC 7.7 08/21/2022   HGB 11.7 (L) 08/21/2022   HCT 35.3 (L) 08/21/2022   MCV 80.2 08/21/2022   PLT 219 08/21/2022    Assessment / Plan: Induction of labor due to gestational hypertension 1421 3/80/-3 1621 Started Pitocin 1812 4/80/-3-2 1837 SROM 0139 6/80/-1 0155 Pitocin dropped to 30mU 0202 Tums given 0420 6.5/80/-1 0745 8/90/0 0915 9.5/90/+1 Pitocin currently at 3mu/min  Labor:  Progressing normally Preeclampsia:  labs stable Fetal Wellbeing:  Category II Pain Control:  Epidural I/D:   GBS negative , SROM x 15hrs Anticipated MOD:  NSVD  Gertie Fey, CNM 08/23/2022, 9:21 AM

## 2022-08-23 NOTE — Progress Notes (Signed)
Notified by RN around 1630 that patient is trickling bleeding and has lost an additional 332ml since delivery. Patient had voided within 1-2 hours prior. Uterine fundus very firm, 2 fingers above umbilicus and to the left and globular shaped. Attempted I&O cath and drained 31ml urine. Fundal rub only gives small amount bleeding, small trickle continues, no clots expelled. 871mcg cytotec placed rectally and TXA given. Unable to determine if blood clots present via bedside US. Discussed with Dr. Glennon Mac and he recommends formal ultrasound. GYN ultrasound ordered STAT at 5pm and patient went down for [place] at 5:38pm. US shows uterus with blood clots. Discussed manual sweep with patient and she is agreeable. Administered 150mcg Fentanyl IV and manual sweep with three passes to remove 789ml blood clots. Uterus immediately firm and at umbilicus and bleeding scant. Jada placed. Uterus remains firm. Bleeding remains scant. Total QBL since delivery 2140ml. STAT CBC ordered. Have 2u PRBCs crossmatched pending CBC or patient's symptoms. Patient denies any dizziness or shortness of breath. Ampicillin for endometritis prophylaxis. Dr. Glennon Mac updated.   Gertie Fey, CNM 08/23/2022 7:00 PM

## 2022-08-23 NOTE — Progress Notes (Signed)
Labor Progress Note  Nancy Mcfarland is a 27 y.o. G2P0010 at [redacted]w[redacted]d by LMP admitted for induction of labor due to Gestational hypertension.  Subjective: Pt has been getting some sleep tonight  Objective: BP 116/66 (BP Location: Right Arm)   Pulse 90   Temp 99 F (37.2 C) (Oral)   Resp 18   Ht 5\' 3"  (1.6 m)   Wt 104.3 kg   LMP 11/10/2021 (Exact Date)   SpO2 100%   BMI 40.74 kg/m   Vitals:   08/22/22 0905 08/22/22 0910 08/22/22 0915 08/22/22 0930  BP: (!) 90/48 112/65 (!) 106/57 (!) 106/58   08/22/22 1030 08/22/22 1130 08/22/22 1240 08/22/22 1335  BP: 103/62 110/65 121/78 123/84   08/22/22 1435 08/22/22 1937 08/22/22 2038 08/22/22 2143  BP: 122/81 124/70 126/75 116/66      Fetal Assessment: FHT:  FHR: 130 bpm, variability: moderate,  accelerations:  Present,  decelerations:  Absent Category/reactivity:  Category I UC:   regular, every 2-3 minutes SVE:    Dilation: 6 cm  Effacement: 80%  Station:  -1  Consistency: soft  Position: anterior  Membrane status: SROM at 1837 Amniotic color: Clear  Labs: Lab Results  Component Value Date   WBC 7.7 08/21/2022   HGB 11.7 (L) 08/21/2022   HCT 35.3 (L) 08/21/2022   MCV 80.2 08/21/2022   PLT 219 08/21/2022    Assessment / Plan: Induction of labor due to gestational hypertension 1655 FT/50/-3 1811 Cytotec 82mcg PO and PV 2206 FT/50/-3 2212 Cytotec 92mcg PO 0320 FT/50/-3 0333 Cytotec 3mcg PO 0946 1/50/-3 1025 Cytotec 69mcg PV and 34mcg PO 1421 3/80/-3 1621 Started Pitocin 1812 4/80/-3-2 1837 SROM 0139 6/80/-1 Currently at 93mU  Labor: Progressing normally Preeclampsia:  no signs or symptoms of toxicity, labs stable, and BP charted above Fetal Wellbeing:  Category I Pain Control:  Epidural I/D:   Afebrile, GBS neg, SROM x7hrs Anticipated MOD:  NSVD  Clydene Laming, CNM 08/23/2022, 1:43 AM

## 2022-08-23 NOTE — Progress Notes (Signed)
Transferred pt to the bathroom with the [facility] at 1505 after a stable recovery.  She had a syncopal episode in the bathroom where she was transferred back to the bed with the [facility] with charge nurse and other nurse assisting. BP on arrival to bed was 102/48. Started IV bolus and charge nurse notified CNM.

## 2022-08-23 NOTE — Progress Notes (Signed)
Labor Progress Note  Nancy Mcfarland is a 27 y.o. G2P0010 at [redacted]w[redacted]d by LMP admitted for induction of labor due to Gestational hypertension.  Subjective: Per RN, pt is doing well  Objective: BP 137/83   Pulse 92   Temp 98.6 F (37 C) (Oral)   Resp 16   Ht 5\' 3"  (1.6 m)   Wt 104.3 kg   LMP 11/10/2021 (Exact Date)   SpO2 100%   BMI 40.74 kg/m   Vitals:   08/22/22 1240 08/22/22 1335 08/22/22 1435 08/22/22 1937  BP: 121/78 123/84 122/81 124/70   08/22/22 2038 08/22/22 2143 08/23/22 0037 08/23/22 0137  BP: 126/75 116/66 121/80 115/77   08/23/22 0237 08/23/22 0337 08/23/22 0437 08/23/22 0537  BP: 120/65 134/73 139/80 137/83      Fetal Assessment: FHT:  FHR: 130 bpm, variability: moderate,  accelerations:  Present,  decelerations:  Absent Category/reactivity:  Category I UC:   regular, every 2-3 minutes SVE:    Dilation: 6.5 cm  Effacement: 80%  Station:  -1  Consistency: soft  Position: anterior  Membrane status: SROM at 1837 Amniotic color: Clear  Labs: Lab Results  Component Value Date   WBC 7.7 08/21/2022   HGB 11.7 (L) 08/21/2022   HCT 35.3 (L) 08/21/2022   MCV 80.2 08/21/2022   PLT 219 08/21/2022    Assessment / Plan: Induction of labor due to gestational hypertension 1421 3/80/-3 1621 Started Pitocin 1812 4/80/-3-2 1837 SROM 0139 6/80/-1 0155 Pitocin dropped to 63mU 0202 Tums given 0420 6.5/80/-1 Currently at 41mU  Labor: Progressing normally Preeclampsia:  no signs or symptoms of toxicity, labs stable, and BP charted above Fetal Wellbeing:  Category I Pain Control:  Epidural I/D:   Afebrile, GBS neg, SROM x11hrs Anticipated MOD:  NSVD  Clydene Laming, CNM 08/23/2022, 5:42 AM

## 2022-08-23 NOTE — Discharge Summary (Signed)
Obstetrical Discharge Summary  Patient Name: Nancy Mcfarland DOB: May 08, 1996 MRN: [id number]  Date of Admission: 08/21/2022 Date of Delivery: 08/23/2022 Delivered by: Lucrezia Europe, CNM  Date of Discharge: 08/25/2022  Primary OB: [facility] [id number] last menstrual period was 11/10/2021 (exact date). EDC Estimated Date of Delivery: 08/17/22 Gestational Age at Delivery: [redacted]w[redacted]d   Antepartum complications:  Obesity in pregnancy  Low lying placenta now resolved  History of HSV - taking valtrex  History of depression and anxiety - no current medications   Admitting Diagnosis: Encounter for induction of labor  Secondary Diagnosis: Patient Active Problem List   Diagnosis Date Noted   Postpartum hemorrhage, delivered 08/24/2022   NSVD (normal spontaneous vaginal delivery) 08/22/2022   Gestational hypertension 08/21/2022   HSV (herpes simplex virus) +11/13/2015 01/25/2022   Depression affecting pregnancy dx'd 06/2021 01/25/2022   Anemia affecting pregnancy 01/25/2022   Supervision of high-risk pregnancy, third trimester 01/25/2022   Discharge Diagnosis: Term Pregnancy Delivered and PPH      Augmentation: Pitocin and Cytotec Complications: None Intrapartum complications/course: She arrived from the office for evaluation of elevated blood pressures. Diagnosed with gestational HTN and recommended to be induced. She received multiple doses of cytotec then pitocin. SROM with clear fluid. She progressed to 10/100/+2 and pushed about one hour, delivering viable female infant over 2nd degree laceration. Apgars 8/9. Delivery Type: spontaneous vaginal delivery Anesthesia: epidural anesthesia Placenta: spontaneous To Pathology: No  Laceration: 2nd degree Episiotomy: none Newborn Data: Live born female  Birth Weight:  8lb 2oz APGAR: 8, 9  Newborn Delivery   Birth date/time: 08/23/2022 12:14:17 Delivery type: Vaginal, Spontaneous    Postpartum Procedures: transfusion 2 units pRBC  and venofer  Edinburgh:     08/25/2022    5:37 AM  Edinburgh Postnatal Depression Scale Screening Tool  I have been able to laugh and see the funny side of things. 0  I have looked forward with enjoyment to things. 1  I have blamed myself unnecessarily when things went wrong. 2  I have been anxious or worried for no good reason. 3  I have felt scared or panicky for no good reason. 3  Things have been getting on top of me. 1  I have been so unhappy that I have had difficulty sleeping. 0  I have felt sad or miserable. 1  I have been so unhappy that I have been crying. 0  The thought of harming myself has occurred to me. 0  Edinburgh Postnatal Depression Scale Total 11     Post partum course:  Patient had an postpartum course complicated by delayed postpartum hemorrhage of 2158ml. Cytotec, TXA, manual sweep, and Jada placement controlled the bleeding.  Patient required 2 units PRBCs. Blood pressures were mostly normal with some mild elevations.  Blood pressures medications were not started d/t severe anemia, PPH, and actively receiving blood transfusions. By time of discharge on PPD#2, her pain was controlled on oral pain medications; she had appropriate lochia and was ambulating, voiding without difficulty and tolerating regular diet.  Will closely follow blood pressures postpartum.  She was deemed stable for discharge to home.    Discharge Physical Exam:  BP (!) 135/90   Pulse 90   Temp 98.1 F (36.7 C) (Oral)   Resp 18   Ht 5\' 3"  (1.6 m)   Wt 104.3 kg   LMP 11/10/2021 (Exact Date)   SpO2 100%   Breastfeeding Unknown   BMI 40.74 kg/m   General: NAD CV: RRR Pulm: CTABL,  nl effort ABD: s/nd/nt, fundus firm and below the umbilicus Lochia: moderate Perineum: minimal edema/repair well approximated DVT Evaluation: LE non-ttp, no evidence of DVT on exam.  Hemoglobin  Date Value Ref Range Status  08/25/2022 7.6 (L) 12.0 - 15.0 g/dL Final  01/25/2022 10.9 (L) 11.1 - 15.9 g/dL  Final   HCT  Date Value Ref Range Status  08/25/2022 23.0 (L) 36.0 - 46.0 % Final    Comment:    Performed at [facility], [address] [place], [place] [address]   Hematocrit  Date Value Ref Range Status  01/25/2022 34.9 34.0 - 46.6 % Final    Risk assessment for postpartum VTE and prophylactic treatment: Very high risk factors: None High risk factors: BMI 40-50 kg/m2 Moderate risk factors: None  Postpartum VTE prophylaxis with LMWH not indicated  Disposition: stable, discharge to home. Baby Feeding: breast and formula feeding Baby Disposition: home with mom  Rh Immune globulin indicated: No Rubella vaccine given: was not indicated Varivax vaccine given: was not indicated Flu vaccine given in AP setting: No Tdap vaccine given in AP setting: No  Contraception:  Nexplanon  Prenatal Labs:  Blood type/Rh B Positive    Antibody screen Negative    Rubella Immune    Varicella Immune  RPR Nonreactive (02/19 0000)   HBsAg Negative (08/25 1113)  Hep C NR   HIV Non-reactive (02/19 0000)   GC neg  Chlamydia neg  Genetic screening cfDNA negative/AFP neg  1 hour GTT 116  3 hour GTT N/A  GBS Negative/-- (02/19 0000)    Plan:  Loletha Grayer was discharged to home in good condition. Blood pressure check on Wednesday or Thursday.  Mood check in the office in 2 weeks. Follow-up appointment with delivering provider in 6 weeks.  Discharge Medications: Allergies as of 08/25/2022   No Known Allergies      Medication List     TAKE these medications    acetaminophen 325 MG tablet Commonly known as: Tylenol Take 2 tablets (650 mg total) by mouth every 4 (four) hours as needed (for pain scale < 4).   aspirin EC 81 MG tablet Take 81 mg by mouth daily.   ferrous sulfate 325 (65 FE) MG tablet Take 1 tablet (325 mg total) by mouth daily with breakfast.   ibuprofen 600 MG tablet Commonly known as: ADVIL Take 1 tablet (600 mg total) by mouth every 6 (six)  hours as needed for mild pain or cramping.   multivitamin-prenatal 27-0.8 MG Tabs tablet Take 1 tablet by mouth daily at 12 noon.   valACYclovir 500 MG tablet Commonly known as: VALTREX Take by mouth.         Follow-up Information     Gertie Fey, CNM Follow up in 2 week(s).   Specialty: Certified Nurse Midwife Why: 2wk mood check Contact information: [place] [address] [phone]         Gertie Fey, CNM Follow up in 6 week(s).   Specialty: Certified Nurse Midwife Why: 6wk postpartum Contact information: [place] [address] [phone]         [facility]. Schedule an appointment as soon as possible for a visit.   Why: blood pressure check on Wednesday (3/27) or Thursday (3/28). Contact information: [place] [place] [place] [facility] up.   Contact information: [address], [place], [place]  [id number]   [phone]                Signed: [name], CNM 08/25/2022 5:34 PM  [name], CNM Certified Nurse Midwife [facility]

## 2022-08-23 NOTE — Progress Notes (Signed)
Nancy Mcfarland in place for 4 hours and bleeding scant, uterus firm. Suction turned off and 15ml fluid removed from seal, waited 59min, uterus still firm and bleeding scant. Nancy Mcfarland removed easily. Bleeding scant. Patient assisted to bedpan for void.  Gertie Fey, CNM 08/23/2022 11:45 PM

## 2022-08-24 LAB — COMPREHENSIVE METABOLIC PANEL
ALT: 12 U/L (ref 0–44)
ALT: 13 U/L (ref 0–44)
AST: 26 U/L (ref 15–41)
AST: 26 U/L (ref 15–41)
Albumin: 2 g/dL — ABNORMAL LOW (ref 3.5–5.0)
Albumin: 2.2 g/dL — ABNORMAL LOW (ref 3.5–5.0)
Alkaline Phosphatase: 83 U/L (ref 38–126)
Alkaline Phosphatase: 84 U/L (ref 38–126)
Anion gap: 5 (ref 5–15)
Anion gap: 8 (ref 5–15)
BUN: 14 mg/dL (ref 6–20)
BUN: 16 mg/dL (ref 6–20)
CO2: 19 mmol/L — ABNORMAL LOW (ref 22–32)
CO2: 20 mmol/L — ABNORMAL LOW (ref 22–32)
Calcium: 8.2 mg/dL — ABNORMAL LOW (ref 8.9–10.3)
Calcium: 8.6 mg/dL — ABNORMAL LOW (ref 8.9–10.3)
Chloride: 107 mmol/L (ref 98–111)
Chloride: 108 mmol/L (ref 98–111)
Creatinine, Ser: 0.9 mg/dL (ref 0.44–1.00)
Creatinine, Ser: 1.03 mg/dL — ABNORMAL HIGH (ref 0.44–1.00)
GFR, Estimated: >60 mL/min (ref >60)
GFR, Estimated: >60 mL/min (ref >60)
Glucose, Bld: 82 mg/dL (ref 70–99)
Glucose, Bld: 95 mg/dL (ref 70–99)
Potassium: 3.8 mmol/L (ref 3.5–5.1)
Potassium: 4.2 mmol/L (ref 3.5–5.1)
Sodium: 133 mmol/L — ABNORMAL LOW (ref 135–145)
Sodium: 134 mmol/L — ABNORMAL LOW (ref 135–145)
Total Bilirubin: 0.4 mg/dL (ref 0.3–1.2)
Total Bilirubin: 0.5 mg/dL (ref 0.3–1.2)
Total Protein: 4.8 g/dL — ABNORMAL LOW (ref 6.5–8.1)
Total Protein: 5 g/dL — ABNORMAL LOW (ref 6.5–8.1)

## 2022-08-24 LAB — CBC
HCT: 21.3 % — ABNORMAL LOW (ref 36.0–46.0)
HCT: 22.8 % — ABNORMAL LOW (ref 36.0–46.0)
Hemoglobin: 7.1 g/dL — ABNORMAL LOW (ref 12.0–15.0)
Hemoglobin: 7.5 g/dL — ABNORMAL LOW (ref 12.0–15.0)
MCH: 27.3 pg (ref 26.0–34.0)
MCH: 27.4 pg (ref 26.0–34.0)
MCHC: 32.9 g/dL (ref 30.0–36.0)
MCHC: 33.3 g/dL (ref 30.0–36.0)
MCV: 82.2 fL (ref 80.0–100.0)
MCV: 82.9 fL (ref 80.0–100.0)
Platelets: 171 10*3/uL (ref 150–400)
Platelets: 207 10*3/uL (ref 150–400)
RBC: 2.59 MIL/uL — ABNORMAL LOW (ref 3.87–5.11)
RBC: 2.75 MIL/uL — ABNORMAL LOW (ref 3.87–5.11)
RDW: 15.9 % — ABNORMAL HIGH (ref 11.5–15.5)
RDW: 16.4 % — ABNORMAL HIGH (ref 11.5–15.5)
WBC: 12.7 10*3/uL — ABNORMAL HIGH (ref 4.0–10.5)
WBC: 15.9 10*3/uL — ABNORMAL HIGH (ref 4.0–10.5)
nRBC: 0 % (ref 0.0–0.2)
nRBC: 0 % (ref 0.0–0.2)

## 2022-08-24 LAB — PREPARE RBC (CROSSMATCH)

## 2022-08-24 NOTE — Progress Notes (Signed)
Postpartum Day  1  Subjective: voiding and tolerating PO. Reports right foot still feels numb but able to ambulate.  Doing well. Ambulating with assistance, pain managed with PO meds, tolerating regular diet, and voiding without difficulty.   No fever/chills, chest pain, shortness of breath, nausea/vomiting, or leg pain. No nipple or breast pain. No headache, visual changes, or RUQ/epigastric pain.  Objective: BP 123/78 (BP Location: Left Arm)   Pulse 99   Temp 97.6 F (36.4 C) (Oral)   Resp 18   Ht 5\' 3"  (1.6 m)   Wt 104.3 kg   LMP 11/10/2021 (Exact Date)   SpO2 99%   Breastfeeding Unknown   BMI 40.74 kg/m   Vitals:   08/24/22 0004 08/24/22 0043 08/24/22 0058 08/24/22 0113  BP: (!) 141/74 (!) 130/110 120/73 (!) 133/93   08/24/22 0121 08/24/22 0122 08/24/22 0148 08/24/22 0315  BP: 125/67 125/67 125/67 117/78   08/24/22 0318 08/24/22 0607 08/24/22 0655 08/24/22 1100  BP: 117/78 (!) 143/91 (!) 140/92 123/78     Physical Exam:  General: alert, cooperative, and no distress Breasts: soft/nontender CV: RRR Pulm: nl effort, CTABL Abdomen: soft, non-tender, active bowel sounds Uterine Fundus: firm Perineum: minimal edema, repair well approximated Lochia: appropriate DVT Evaluation: No evidence of DVT seen on physical exam.     Latest Ref Rng & Units 08/24/2022    7:15 AM 08/24/2022   12:05 AM 08/23/2022    7:17 PM  CBC  WBC 4.0 - 10.5 K/uL 12.7  15.9  17.8   Hemoglobin 12.0 - 15.0 g/dL 7.5  7.1  8.2   Hematocrit 36.0 - 46.0 % 22.8  21.3  25.1   Platelets 150 - 400 K/uL 171  207  222      Assessment/Plan: 27 y.o. G2P1011 postpartum day # 1  -Continue routine postpartum care -Lactation consult PRN for breastfeeding  -Acute blood loss anemia - s/p PPH, hemodynamically stable and asymptomatic -1 unit pRBC and venofer given 08/23/2022 -Continue PO supplementation with ferrous sulfate  -CBC and Creatinine ordered for tomorrow AM  -Blood pressures mostly normal with some  in mild range. Will continue to monitor and start Labetalol 200 mg BID as appropriate   Disposition: Continue inpatient postpartum care    LOS: 3 days   [name], [name] 08/24/2022, 11:45 AM   ----- [name]  Certified Nurse Midwife [facility]

## 2022-08-24 NOTE — Anesthesia Postprocedure Evaluation (Signed)
Anesthesia Post Note  Patient: CHLOEE GULLIFORD  Procedure(s) Performed: AN AD HOC LABOR EPIDURAL  Patient location during evaluation: Mother Baby Anesthesia Type: Epidural Level of consciousness: awake and alert and oriented Pain management: pain level controlled Vital Signs Assessment: post-procedure vital signs reviewed and stable Respiratory status: spontaneous breathing, respiratory function stable and nonlabored ventilation Cardiovascular status: blood pressure returned to baseline and stable Postop Assessment: no headache, no backache, patient able to bend at knees and able to ambulate Anesthetic complications: no Comments: Mild residual numbness in right foot.  I reassured patient that this would likely resolve by later today and if not, please let her nurse know and we will reevaluate.  She expressed understanding.   No notable events documented.   Last Vitals:  Vitals:   08/24/22 0607 08/24/22 0655  BP: (!) 143/91 (!) 140/92  Pulse: 92 94  Resp:  20  Temp:  37 C  SpO2:  99%    Last Pain:  Vitals:   08/24/22 0655  TempSrc: Oral  PainSc:                  Darrin Nipper

## 2022-08-24 NOTE — Lactation Note (Addendum)
This note was copied from a baby's chart. Lactation Consultation Note  Patient Name: Nancy Mcfarland [id number] Date: 08/24/2022 Age:27 hours Reason for consult: Follow-up assessment;Primapara;Exclusive pumping and bottle feeding;Breastfeeding assistance   Maternal Data Has patient been taught Hand Expression?: Yes Does the patient have breastfeeding experience prior to this delivery?: No  Feeding Mother's Current Feeding Choice: Breast Milk and Formula Mom stated that she would like to try breastfeeding, after mom used hand expression to express drops of colostrum, baby place in cradle hold at left breast, baby sucks on tongue, able to latch with sandwiching breast tissue, but baby would repeatedly push nipple out with tongue, tried in football hold on right breast, baby latched easier in this position but consistently pushed nipple out with tongue, mom then stated that she would like to end feeding and attempt pumping with DEBP, Symphony DEBP set up and mom pumped in initiation mode and obtained 5 cc colostrum.  While mom pumping, dad of baby, attempted formula feed of baby, but baby only took sips of formula due to uncoordinated suck and occ gagging.  Mom attempted feeding baby the 5cc EBM with slow flow bottle but baby had trouble coordinating suck, again pushing nipple out with tongue, the remainder of the colostrum was fed by myself with curved tip syringe with baby sucking on my gloved finger.  He could coordinate his tongue and suck better this way but still needed stimulation to be consistent.     LATCH Score Latch: Repeated attempts needed to sustain latch, nipple held in mouth throughout feeding, stimulation needed to elicit sucking reflex.  Audible Swallowing: None  Type of Nipple: Everted at rest and after stimulation  Comfort (Breast/Nipple): Soft / non-tender  Hold (Positioning): Assistance needed to correctly position infant at breast and maintain latch.  LATCH Score:  6   Lactation Tools Discussed/Used Tools: Pump Breast pump type: Double-Electric Breast Pump Pump Education: Setup, frequency, and cleaning;Milk Storage Reason for Pumping: mom desires to pump and bottlefeed EBM Pumping frequency: instructed to pump q3hr Pumped volume: 5 mL  Interventions Interventions: DEBP Paced bottle feeding Discharge Pump: Hands Free;Manual WIC Program: No  Consult Status Consult Status: PRN Date: 08/24/22 Follow-up type: In-patient    Ferol Luz 08/24/2022, 1:14 PM

## 2022-08-24 NOTE — Lactation Note (Signed)
This note was copied from a baby's chart. Lactation Consultation Note  Patient Name: Nancy Mcfarland [id number] Date: 08/24/2022 Age:27 hours Reason for consult: Initial assessment;Primapara;Term   Maternal Data Does the patient have breastfeeding experience prior to this delivery?: No  Feeding Mother's Current Feeding Choice: Breast Milk and Formula Nipple Type: Slow - flow Mom states she wants to breastfeed and pump her breasts, she tried breastfeeding x 1 yesterday but baby did not latch, encouraged to call [place] at next feeding before offering formula to attempt breastfeeding LATCH Score                    Lactation Tools Discussed/Used    Interventions  LC name and no written on white board and mom encouraged to call for assistance at next feeding  Discharge Pump: Hands Free;Manual  Consult Status Consult Status: Follow-up Date: 08/24/22 Follow-up type: In-patient    Ferol Luz 08/24/2022, 11:00 AM

## 2022-08-25 LAB — BPAM RBC
Blood Product Expiration Date: 202404122359
Blood Product Expiration Date: 202404142359
ISSUE DATE / TIME: 202403230113
ISSUE DATE / TIME: 202403231628
Unit Type and Rh: 7300
Unit Type and Rh: 7300

## 2022-08-25 LAB — TYPE AND SCREEN
ABO/RH(D): B POS
Antibody Screen: NEGATIVE
Unit division: 0
Unit division: 0

## 2022-08-25 LAB — HEMOGLOBIN AND HEMATOCRIT, BLOOD
HCT: 23 % — ABNORMAL LOW (ref 36.0–46.0)
Hemoglobin: 7.6 g/dL — ABNORMAL LOW (ref 12.0–15.0)

## 2022-08-25 LAB — CBC
HCT: 20.3 % — ABNORMAL LOW (ref 36.0–46.0)
Hemoglobin: 6.7 g/dL — ABNORMAL LOW (ref 12.0–15.0)
MCH: 27.3 pg (ref 26.0–34.0)
MCHC: 33 g/dL (ref 30.0–36.0)
MCV: 82.9 fL (ref 80.0–100.0)
Platelets: 176 10*3/uL (ref 150–400)
RBC: 2.45 MIL/uL — ABNORMAL LOW (ref 3.87–5.11)
RDW: 16.1 % — ABNORMAL HIGH (ref 11.5–15.5)
WBC: 11.3 10*3/uL — ABNORMAL HIGH (ref 4.0–10.5)
nRBC: 0 % (ref 0.0–0.2)

## 2022-08-25 LAB — CREATININE, SERUM
Creatinine, Ser: 0.65 mg/dL (ref 0.44–1.00)
GFR, Estimated: >60 mL/min (ref >60)

## 2022-08-25 LAB — PREPARE RBC (CROSSMATCH)

## 2022-08-25 MED ORDER — IBUPROFEN 600 MG PO TABS: 600.0000 mg | ORAL_TABLET | Freq: Four times a day (QID) | ORAL | 0 refills | Status: AC | PRN

## 2022-08-25 MED ORDER — SODIUM CHLORIDE 0.9% IV SOLUTION: Freq: Once | INTRAVENOUS | Status: AC

## 2022-08-25 MED ORDER — ACETAMINOPHEN 325 MG PO TABS: 650.0000 mg | ORAL_TABLET | ORAL | Status: AC | PRN

## 2022-08-25 NOTE — Progress Notes (Signed)
Postpartum Day  2  Subjective: no complaints, up ad lib, voiding, and tolerating PO  Doing well, no concerns. Ambulating without difficulty, pain managed with PO meds, tolerating regular diet, and voiding without difficulty.   No fever/chills, chest pain, shortness of breath, nausea/vomiting, or leg pain. No nipple or breast pain. No headache, visual changes, or RUQ/epigastric pain.  Objective: BP 123/87 (BP Location: Right Arm)   Pulse (!) 103   Temp 98.3 F (36.8 C) (Oral)   Resp 18   Ht 5\' 3"  (1.6 m)   Wt 104.3 kg   LMP 11/10/2021 (Exact Date)   SpO2 100%   Breastfeeding Unknown   BMI 40.74 kg/m   Vitals:   08/24/22 0122 08/24/22 0148 08/24/22 0315 08/24/22 0318  BP: 125/67 125/67 117/78 117/78   08/24/22 0607 08/24/22 0655 08/24/22 1100 08/24/22 1603  BP: (!) 143/91 (!) 140/92 123/78 127/73   08/24/22 2121 08/24/22 2349 08/25/22 0406 08/25/22 0700  BP: 133/86 (!) 134/95 127/78 123/87   Physical Exam:  General: alert, cooperative, fatigued, and no distress Breasts: soft/nontender CV: RRR Pulm: nl effort, CTABL Abdomen: soft, non-tender, active bowel sounds Uterine Fundus: firm Perineum: minimal edema, repair well approximated Lochia: appropriate DVT Evaluation: No evidence of DVT seen on physical exam.  Recent Labs    08/24/22 0715 08/25/22 0711  HGB 7.5* 6.7*  HCT 22.8* 20.3*  WBC 12.7* 11.3*  PLT 171 176    Assessment/Plan: 27 y.o. G2P1011 postpartum day # 2  -Continue routine postpartum care -Encouraged snug fitting bra, cold application, Tylenol PRN, and cabbage leaves for engorgement for formula feeding  -Acute blood loss anemia - s/p PPH, 1 unit pRBC, and venofer.  Mild tachycardia over night and this morning.  General fatigue.  Denies dizziness and SOB.  Discussed 2nd unit pRBC prior to discharge today.  Nancy Mcfarland is amenable to an additional unit. -Will check CBC 3-4 hours post transfusion and plan to discharge home later this evening.  -Blood  pressures mostly WNL with some mild range BP's.  Discussed starting antihypertensive medication now versus waiting and monitoring BP's.  Will hold medication for right now given severe anemia, PPH, and receiving blood transfusions.   -Will plan BP check in office this week.  -Immunization status:   all immunizations up to date -Dr. Glennon Mac updated on assessment, labs, and plan of care.    Disposition: Continue inpatient postpartum care. Desires discharge home today after transfusion.    LOS: 4 days   [name], [name] 08/25/2022, 9:54 AM   ----- [name]  Certified Nurse Midwife [facility]

## 2022-08-25 NOTE — TOC Initial Note (Addendum)
Transition of Care [facility]) - Initial/Assessment Note    Patient Details  Name: Nancy Mcfarland MRN: [id number] Date of Birth: 08-10-1995  Transition of Care [facility]) CM/SW Contact:    [address], [place] Phone Number: 08/25/2022, 11:08 AM  Clinical Narrative:                 Update: CSW spoke with pt via phone concerning her [place] results. Pt polite during phone call, denies and SI/HI, states results are more so based on her anxiety than depression. Pt states she was taking medication prior to her child's birth and plans on reached back out to her PCP to restart that medication. CSW spoke with pt about therapy/counseling, pt states she would be interested in exploring options, resources will be placed on the AVS. CSW asked about safe place for baby to sleep, mom states yes, no needs for the baby at this time. Pt states she see's [facility]. Pt states she has already chosen a PCP for her baby, [facility]. CSW spoke with mom about the difference between baby blues and PPD, reminded her that this information will be on her AVS and should she notice any warning signs to notify a doctor promptly, she verbalized understanding. No other questions asked.   CSW attempted to speak with pt concerning her Edinburgh score, pt requested CSW to call back in about 45 minutes.         Patient Goals and CMS Choice            Expected Discharge Plan and Services                                              Prior Living Arrangements/Services                       Activities of Daily Living Home Assistive Devices/Equipment: None ADL Screening (condition at time of admission) Patient's cognitive ability adequate to safely complete daily activities?: Yes Is the patient deaf or have difficulty hearing?: No Does the patient have difficulty seeing, even when wearing glasses/contacts?: No Does the patient have difficulty concentrating, remembering, or making  decisions?: No Patient able to express need for assistance with ADLs?: Yes Does the patient have difficulty dressing or bathing?: No Independently performs ADLs?: Yes (appropriate for developmental age) Does the patient have difficulty walking or climbing stairs?: No Weakness of Legs: None Weakness of Arms/Hands: None  Permission Sought/Granted                  Emotional Assessment              Admission diagnosis:  Encounter for elective induction of labor [Z34.90] Patient Active Problem List   Diagnosis Date Noted   Postpartum hemorrhage, delivered 08/24/2022   NSVD (normal spontaneous vaginal delivery) 08/22/2022   Gestational hypertension 08/21/2022   HSV (herpes simplex virus) +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 01/25/2022   Depression affecting pregnancy dx'd 06/2021 01/25/2022   Anemia affecting pregnancy 01/25/2022   Supervision of high-risk pregnancy, third trimester 01/25/2022   PCP:  [facility]:   [facility] [address] [place] [address] Phone: [phone] Fax: [phone]     Social Determinants of Health (SDOH) Social History: SDOH  Screenings   Food Insecurity: No Food Insecurity (08/21/2022)  Housing: Low Risk  (08/21/2022)  Transportation Needs: No Transportation Needs (08/21/2022)  Utilities: Not At Risk (08/21/2022)  Depression (PHQ2-9): Medium Risk (01/25/2022)  Financial Resource Strain: Low Risk  (01/25/2022)  Tobacco Use: Medium Risk (08/23/2022)   SDOH Interventions:     Readmission Risk Interventions     No data to display

## 2022-08-25 NOTE — Progress Notes (Signed)
Patient discharged home with family.  Discharge instructions, when to follow up, and prescriptions reviewed with patient.  Patient verbalized understanding. Patient will be escorted out by auxiliary.   

## 2022-08-25 NOTE — Lactation Note (Signed)
This note was copied from a baby's chart. Lactation Consultation Note  Patient Name: Nancy Mcfarland [id number] Date: 08/25/2022 Age:27 hours Reason for consult: Follow-up assessment   LC to Mother's room. Mother states she no longer is wanting to pump. Reviewed how to not stimulate the breast and can use ice and cabbage for several days to help with swelling and discomfort. Mother has no questions. Encouraged to feed at least 8x's/24hours with voids and stools appropriate for days of life.    Feeding Mother's Current Feeding Choice: Formula Nipple Type: Dr. Roosvelt Harps Preemie Discharge Discharge Education: Warning signs for feeding baby;Engorgement and breast care;Other (comment) (discussed how to use ice, and cabbage to help dry up supply with minimal stimulation)  Consult Status Consult Status: Complete  Traveon Louro D Chanc Kervin 08/25/2022, 10:48 AM

## 2022-08-25 NOTE — Discharge Instructions (Signed)
Vaginal Delivery, Care After Refer to this sheet in the next few weeks. These discharge instructions provide you with information on caring for yourself after delivery. Your caregiver may also give you specific instructions. Your treatment has been planned according to the most current medical practices available, but problems sometimes occur. Call your caregiver if you have any problems or questions after you go home. HOME CARE INSTRUCTIONS Take over-the-counter or prescription medicines only as directed by your caregiver or pharmacist. Do not drink alcohol, especially if you are breastfeeding or taking medicine to relieve pain. Do not smoke tobacco. Continue to use good perineal care. Good perineal care includes: Wiping your perineum from back to front Keeping your perineum clean. You can do sitz baths twice a day, to help keep this area clean Do not use tampons, douche or have sex until your caregiver says it is okay. Shower only and avoid sitting in submerged water, aside from sitz baths Wear a well-fitting bra that provides breast support. Eat healthy foods. Drink enough fluids to keep your urine clear or pale yellow. Eat high-fiber foods such as whole grain cereals and breads, brown rice, beans, and fresh fruits and vegetables every day. These foods may help prevent or relieve constipation. Avoid constipation with high fiber foods or medications, such as miralax or metamucil Follow your caregiver's recommendations regarding resumption of activities such as climbing stairs, driving, lifting, exercising, or traveling. Talk to your caregiver about resuming sexual activities. Resumption of sexual activities is dependent upon your risk of infection, your rate of healing, and your comfort and desire to resume sexual activity. Try to have someone help you with your household activities and your newborn for at least a few days after you leave the hospital. Rest as much as possible. Try to rest or  take a nap when your newborn is sleeping. Increase your activities gradually. Keep all of your scheduled postpartum appointments. It is very important to keep your scheduled follow-up appointments. At these appointments, your caregiver will be checking to make sure that you are healing physically and emotionally. SEEK MEDICAL CARE IF:  You are passing large clots from your vagina. Save any clots to show your caregiver. You have a foul smelling discharge from your vagina. You have trouble urinating. You are urinating frequently. You have pain when you urinate. You have a change in your bowel movements. You have increasing redness, pain, or swelling near your vaginal incision (episiotomy) or vaginal tear. You have pus draining from your episiotomy or vaginal tear. Your episiotomy or vaginal tear is separating. You have painful, hard, or reddened breasts. You have a severe headache. You have blurred vision or see spots. You feel sad or depressed. You have thoughts of hurting yourself or your newborn. You have questions about your care, the care of your newborn, or medicines. You are dizzy or light-headed. You have a rash. You have nausea or vomiting. You were breastfeeding and have not had a menstrual period within 12 weeks after you stopped breastfeeding. You are not breastfeeding and have not had a menstrual period by the 12th week after delivery. You have a fever. SEEK IMMEDIATE MEDICAL CARE IF:  You have persistent pain. You have chest pain. You have shortness of breath. You faint. You have leg pain. You have stomach pain. Your vaginal bleeding saturates two or more sanitary pads in 1 hour. MAKE SURE YOU:  Understand these instructions. Will watch your condition. Will get help right away if you are not doing well or   get worse. Document Released: 05/17/2000 Document Revised: 10/04/2013 Document Reviewed: 01/15/2012 ExitCare Patient Information 2015 [employer]. This  information is not intended to replace advice given to you by your health care provider. Make sure you discuss any questions you have with your health care provider.  Sitz Bath A sitz bath is a warm water bath taken in the sitting position. The water covers only the hips and butt (buttocks). We recommend using one that fits in the toilet, to help with ease of use and cleanliness. It may be used for either healing or cleaning purposes. Sitz baths are also used to relieve pain, itching, or muscle tightening (spasms). The water may contain medicine. Moist heat will help you heal and relax.  HOME CARE  Take 3 to 4 sitz baths a day. Fill the bathtub half-full with warm water. Sit in the water and open the drain a little. Turn on the warm water to keep the tub half-full. Keep the water running constantly. Soak in the water for 15 to 20 minutes. After the sitz bath, pat the affected area dry. GET HELP RIGHT AWAY IF: You get worse instead of better. Stop the sitz baths if you get worse. MAKE SURE YOU: Understand these instructions. Will watch your condition. Will get help right away if you are not doing well or get worse. Document Released: 06/27/2004 Document Revised: 02/12/2012 Document Reviewed: 09/17/2010 ExitCare Patient Information 2015 [employer]. This information is not intended to replace advice given to you by your health care provider. Make sure you discuss any questions you have with your health care provider.     

## 2022-08-26 LAB — TYPE AND SCREEN
ABO/RH(D): B POS
Antibody Screen: NEGATIVE
Unit division: 0

## 2022-08-26 LAB — BPAM RBC
Blood Product Expiration Date: 202404142359
ISSUE DATE / TIME: 202403241115
Unit Type and Rh: 7300

## 2022-09-24 ENCOUNTER — Telehealth: Payer: Self-pay

## 2022-09-24 NOTE — Telephone Encounter (Signed)
[facility]- Discharge Call Backs-Left Voicemail about the following below. 1-Do you have any questions or concerns about yourself as you heal? 2-Any concerns or questions about your baby? 3-How was your stay at the hospital? 4- Did our team work together to care for you? You should be receiving a survey in the mail soon.   We would really appreciate it if you could fill that out for [place] and return it in the mail.  We value the feedback to make improvements and continue the great work we do.   If you have any questions please feel free to call me back at [phone]
